# Patient Record
Sex: Female | Born: 2000 | Race: Black or African American | Hispanic: No | Marital: Single | Smoking: Never smoker
Health system: Southern US, Community
[De-identification: ages and names within clinical notes are randomized; demographics above are authoritative.]

## PROBLEM LIST (undated history)

## (undated) DIAGNOSIS — J309 Allergic rhinitis, unspecified: Secondary | ICD-10-CM

## (undated) HISTORY — DX: Allergic rhinitis, unspecified: J30.9

---
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
## 2021-06-01 DIAGNOSIS — L0291 Cutaneous abscess, unspecified: Secondary | ICD-10-CM | POA: Diagnosis not present

## 2021-08-21 DIAGNOSIS — Z3046 Encounter for surveillance of implantable subdermal contraceptive: Secondary | ICD-10-CM | POA: Diagnosis not present

## 2021-08-21 DIAGNOSIS — Z113 Encounter for screening for infections with a predominantly sexual mode of transmission: Secondary | ICD-10-CM | POA: Diagnosis not present

## 2021-08-21 DIAGNOSIS — Z3200 Encounter for pregnancy test, result unknown: Secondary | ICD-10-CM | POA: Diagnosis not present

## 2021-09-07 DIAGNOSIS — L0291 Cutaneous abscess, unspecified: Secondary | ICD-10-CM | POA: Diagnosis not present

## 2022-03-19 ENCOUNTER — Emergency Department (HOSPITAL_COMMUNITY)
Admission: EM | Admit: 2022-03-19 | Discharge: 2022-03-19 | Disposition: A | Discharge status: Home / Self Care | Payer: Medicaid Other | Attending: Emergency Medicine | Admitting: Emergency Medicine

## 2022-03-19 ENCOUNTER — Encounter (HOSPITAL_COMMUNITY): Payer: Self-pay

## 2022-03-19 ENCOUNTER — Emergency Department (HOSPITAL_COMMUNITY): Payer: Medicaid Other

## 2022-03-19 DIAGNOSIS — R197 Diarrhea, unspecified: Secondary | ICD-10-CM | POA: Diagnosis not present

## 2022-03-19 DIAGNOSIS — N9489 Other specified conditions associated with female genital organs and menstrual cycle: Secondary | ICD-10-CM | POA: Diagnosis not present

## 2022-03-19 DIAGNOSIS — K529 Noninfective gastroenteritis and colitis, unspecified: Secondary | ICD-10-CM | POA: Diagnosis not present

## 2022-03-19 DIAGNOSIS — R1013 Epigastric pain: Secondary | ICD-10-CM | POA: Diagnosis present

## 2022-03-19 LAB — LIPASE, BLOOD: Lipase: 20 U/L (ref 11–51)

## 2022-03-19 LAB — COMPREHENSIVE METABOLIC PANEL
ALT: 16 U/L (ref 0–44)
AST: 17 U/L (ref 15–41)
Albumin: 3.9 g/dL (ref 3.5–5.0)
Alkaline Phosphatase: 73 U/L (ref 38–126)
Anion gap: 5 (ref 5–15)
BUN: 6 mg/dL (ref 6–20)
CO2: 24 mmol/L (ref 22–32)
Calcium: 9 mg/dL (ref 8.9–10.3)
Chloride: 110 mmol/L (ref 98–111)
Creatinine, Ser: 0.67 mg/dL (ref 0.44–1.00)
GFR, Estimated: >60 mL/min (ref >60)
Glucose, Bld: 105 mg/dL — ABNORMAL HIGH (ref 70–99)
Potassium: 3.6 mmol/L (ref 3.5–5.1)
Sodium: 139 mmol/L (ref 135–145)
Total Bilirubin: 0.5 mg/dL (ref 0.3–1.2)
Total Protein: 7.7 g/dL (ref 6.5–8.1)

## 2022-03-19 LAB — CBC
HCT: 37.5 % (ref 36.0–46.0)
Hemoglobin: 12 g/dL (ref 12.0–15.0)
MCH: 27 pg (ref 26.0–34.0)
MCHC: 32 g/dL (ref 30.0–36.0)
MCV: 84.5 fL (ref 80.0–100.0)
Platelets: 296 10*3/uL (ref 150–400)
RBC: 4.44 MIL/uL (ref 3.87–5.11)
RDW: 14.8 % (ref 11.5–15.5)
WBC: 11.2 10*3/uL — ABNORMAL HIGH (ref 4.0–10.5)
nRBC: 0 % (ref 0.0–0.2)

## 2022-03-19 LAB — URINALYSIS, ROUTINE W REFLEX MICROSCOPIC
Bilirubin Urine: NEGATIVE
Glucose, UA: NEGATIVE mg/dL
Hgb urine dipstick: NEGATIVE
Ketones, ur: NEGATIVE mg/dL
Nitrite: NEGATIVE
Protein, ur: 30 mg/dL — AB
Specific Gravity, Urine: 1.03 (ref 1.005–1.030)
pH: 6 (ref 5.0–8.0)

## 2022-03-19 LAB — I-STAT BETA HCG BLOOD, ED (MC, WL, AP ONLY): I-stat hCG, quantitative: <5 m[IU]/mL (ref <5)

## 2022-03-19 MED ORDER — SODIUM CHLORIDE 0.9 % IV BOLUS
1000.0000 mL | Freq: Once | INTRAVENOUS | Status: AC
  Administered 2022-03-19: 1000 mL via INTRAVENOUS

## 2022-03-19 MED ORDER — IOHEXOL 300 MG/ML  SOLN
100.0000 mL | Freq: Once | INTRAMUSCULAR | Status: AC | PRN
Start: 2022-03-19 — End: 2022-03-19
  Administered 2022-03-19: 100 mL via INTRAVENOUS

## 2022-03-19 MED ORDER — KETOROLAC TROMETHAMINE 15 MG/ML IJ SOLN
15.0000 mg | Freq: Once | INTRAMUSCULAR | Status: AC
  Administered 2022-03-19: 15 mg via INTRAVENOUS
  Filled 2022-03-19: qty 1

## 2022-03-19 MED ORDER — LIDOCAINE VISCOUS HCL 2 % MT SOLN
30.0000 mL | Freq: Two times a day (BID) | OROMUCOSAL | 0 refills | Status: AC | PRN
Start: 2022-03-19 — End: 2022-03-22

## 2022-03-19 MED ORDER — DICYCLOMINE HCL 10 MG PO CAPS
10.0000 mg | ORAL_CAPSULE | Freq: Once | ORAL | Status: AC
  Administered 2022-03-19: 10 mg via ORAL
  Filled 2022-03-19: qty 1

## 2022-03-19 MED ORDER — LIDOCAINE VISCOUS HCL 2 % MT SOLN
15.0000 mL | Freq: Once | OROMUCOSAL | Status: AC
Start: 2022-03-19 — End: 2022-03-19
  Administered 2022-03-19: 15 mL via ORAL
  Filled 2022-03-19: qty 15

## 2022-03-19 MED ORDER — ALUM & MAG HYDROXIDE-SIMETH 200-200-20 MG/5ML PO SUSP
30.0000 mL | Freq: Once | ORAL | Status: AC
  Administered 2022-03-19: 30 mL via ORAL
  Filled 2022-03-19: qty 30

## 2022-03-19 MED ORDER — CIPROFLOXACIN HCL 500 MG PO TABS: 500.0000 mg | ORAL_TABLET | Freq: Two times a day (BID) | ORAL | 0 refills | Status: AC

## 2022-03-19 NOTE — ED Notes (Signed)
I provided reinforced discharge education based off of discharge instructions. Pt acknowledged and understood my education. Pt had no further questions/concerns for provider/myself.  °

## 2022-03-19 NOTE — Discharge Instructions (Signed)
As we discussed, CT imaging of your abdomen revealed some inflammation in your colon.  This can be due to bacteria and therefore I have given you a prescription for an antibiotic to take as prescribed for management of same.  Additionally, you noted some improvement with the oral mixture that we gave you, and therefore I have given you a prescription for same.  Please only take this as prescribed as needed.  I also recommend that you get a probiotic when you go to pick up your prescribed medications.  You can get this over-the-counter.  There are several studies that show improvement in diarrhea with addition of a probiotic.  I also recommend that you maintain a bland diet with foods such as bananas, rice, applesauce and toast.  I have attached some food choice recommendations to help relieve your symptoms.  Additionally, it is extremely important that you follow-up with your primary care doctor in the next few days to rule out other causes of your diarrhea that we discussed today.  Return if development of any new or worsening symptoms.

## 2022-03-19 NOTE — ED Provider Triage Note (Signed)
Emergency Medicine Provider Triage Evaluation Note  Isabel Hart , a 21 y.o. female  was evaluated in triage.  Pt complains of abdominal pain, vomiting, and diarrhea for the past 4 days.  Pain is worse with laying down.  No alleviating factors. No sick contacts.   Review of Systems  Positive: Abdominal pain, vomiting, diarrhea Negative: Fever, urinary symptoms  Physical Exam  BP (!) 143/78 (BP Location: Left Arm)   Pulse (!) 114   Temp 98.7 F (37.1 C) (Oral)   Resp 16   SpO2 95%  Gen:   Awake, no distress   Resp:  Normal effort  MSK:   Moves extremities without difficulty  Other:    Medical Decision Making  Medically screening exam initiated at 2:15 PM.  Appropriate orders placed.  Isabel Hart was informed that the remainder of the evaluation will be completed by another provider, this initial triage assessment does not replace that evaluation, and the importance of remaining in the ED until their evaluation is complete.     Norlene Lanes T, PA-C 03/19/22 1416

## 2022-03-19 NOTE — ED Provider Notes (Signed)
[facility]-EMERGENCY DEPT Provider Note   CSN: [id number] Arrival date & time: 03/19/22  1252     History  Chief Complaint  Patient presents with   Abdominal Pain    Isabel Hart is a 21 y.o. female.  Patient with no pertinent past medical history presents today with complaints of nausea, vomiting, diarrhea, and abdominal pain. She states that her symptoms began initially on Friday with several bouts of watery diarrhea. She states that same has persisted since then with 2 episodes of nausea and vomiting on Sunday. States that she is also having persistent epigastric abdominal pain that is always present but intermittently severe in nature without discernable triggers. She states that she has had a diminished appetite due to pain and nausea. She denies any dysuria or hematuria. Last menstrual cycle was about 6 weeks ago, but states that she has Nexplanon implant which has caused her to have irregular menstrual cycles. She denies any vaginal discharge, bleeding, or pain. Patient denies any hematochezia or melena. Denies any known sick contacts, recent travel or drinking of unclean water. No recent antibiotic use.  The history is provided by the patient. No language interpreter was used.  Abdominal Pain Associated symptoms: diarrhea, nausea and vomiting        Home Medications Prior to Admission medications   Medication Sig Start Date End Date Taking? Authorizing Provider  clindamycin-benzoyl peroxide (BENZACLIN) gel Apply each morning. 11/12/18   Jonetta Osgood, MD      Allergies    Patient has no known allergies.    Review of Systems   Review of Systems  Gastrointestinal:  Positive for abdominal pain, diarrhea, nausea and vomiting.  All other systems reviewed and are negative.   Physical Exam Updated Vital Signs BP 131/79   Pulse 86   Temp 98.7 F (37.1 C) (Oral)   Resp 18   SpO2 98%  Physical Exam Vitals and nursing note reviewed.  Constitutional:       General: She is not in acute distress.    Appearance: Normal appearance. She is normal weight. She is not ill-appearing, toxic-appearing or diaphoretic.  HENT:     Head: Normocephalic and atraumatic.  Cardiovascular:     Rate and Rhythm: Normal rate.  Pulmonary:     Effort: Pulmonary effort is normal. No respiratory distress.  Abdominal:     General: Abdomen is flat.     Palpations: Abdomen is soft.     Tenderness: There is abdominal tenderness in the periumbilical area.  Musculoskeletal:        General: Normal range of motion.     Cervical back: Normal range of motion.  Skin:    General: Skin is warm and dry.  Neurological:     General: No focal deficit present.     Mental Status: She is alert.  Psychiatric:        Mood and Affect: Mood normal.        Behavior: Behavior normal.     ED Results / Procedures / Treatments   Labs (all labs ordered are listed, but only abnormal results are displayed) Labs Reviewed  COMPREHENSIVE METABOLIC PANEL - Abnormal; Notable for the following components:      Result Value   Glucose, Bld 105 (*)    All other components within normal limits  CBC - Abnormal; Notable for the following components:   WBC 11.2 (*)    All other components within normal limits  URINALYSIS, ROUTINE W REFLEX MICROSCOPIC - Abnormal; Notable  for the following components:   APPearance CLOUDY (*)    Protein, ur 30 (*)    Leukocytes,Ua TRACE (*)    Bacteria, UA RARE (*)    All other components within normal limits  LIPASE, BLOOD  I-STAT BETA HCG BLOOD, ED ([facility], [facility], [facility] ONLY)    EKG None  Radiology No results found.  Procedures Procedures    Medications Ordered in ED Medications  sodium chloride 0.9 % bolus 1,000 mL (has no administration in time range)  dicyclomine (BENTYL) capsule 10 mg (has no administration in time range)    ED Course/ Medical Decision Making/ A&P                           Medical Decision Making Amount and/or Complexity of  Data Reviewed Labs: ordered. Radiology: ordered.  Risk Prescription drug management.   This patient presents to the ED for concern of abdominal pain, nausea, vomiting, diarrhea, this involves an extensive number of treatment options, and is a complaint that carries with it a high risk of complications and morbidity.   Co morbidities that complicate the patient evaluation  none   Lab Tests:  I Ordered, and personally interpreted labs.  The pertinent results include:  WBC 11.2. UA with proteinuria, trace leukocytes, rare bacteria. Patient denies urinary symptoms   Imaging Studies ordered:  I ordered imaging studies including CT abdomen pelvis  I independently visualized and interpreted imaging which showed  Diffuse wall thickening and edema involving the cecum and ascending colon with pericecal stranding, fluid, and lymphadenopathy. Changes likely to represent colitis, possibly infectious or inflammatory. Follow-up after resolution of acute process is recommended to exclude underlying neoplasm. I agree with the radiologist interpretation   Problem List / ED Course / Critical interventions / Medication management  I ordered medication including Toradol, bentyl, and Gi cocktail  for pain with fluids for dehydration  Reevaluation of the patient after these medicines showed that the patient improved I have reviewed the patients home medicines and have made adjustments as needed   Test / Admission - Considered:  Patient is nontoxic, nonseptic appearing, in no apparent distress.  Patient's pain and other symptoms adequately managed in emergency department.  Fluid bolus given.  Labs, imaging and vitals reviewed.  Patient does not meet the SIRS or Sepsis criteria.  On repeat exam patient does not have a surgical abdomin and there are no peritoneal signs.  No indication of appendicitis, bowel obstruction, bowel perforation, cholecystitis, diverticulitis, PID or ectopic pregnancy.   Patients symptoms consistent with CT findings of colitis. Will treat for infectious colitis with Ciprofloxacin. Patient also informed of CT findings and emphasized the importance of close PCP follow-up to exclude other causes of her symptoms. Given clinical picture I have a lower suspicion that this is the case, however she will need to be evaluated further once her symptoms improve. Patient is understanding and aware of this. Also recommend daily probiotic  and BRAT diet for the next few days to help with symptoms. Patient also noted improvement with GI cocktail, will discharge home with same. I have also discussed reasons to return immediately to the ER.  Patient expresses understanding and agrees with plan. All questions answered. Stable for discharge home at this time. She is able to tolerate po intake without nausea or vomiting. Discharged in stable condition.   Final Clinical Impression(s) / ED Diagnoses Final diagnoses:  Colitis  Diarrhea of presumed infectious origin  Rx / DC Orders ED Discharge Orders          Ordered    ciprofloxacin (CIPRO) 500 MG tablet  2 times daily        03/19/22 2339    GI Cocktail (alum & mag hydroxide, lidocaine, dicyclomine) oral mixture  2 times daily PRN        03/19/22 2339          An After Visit Summary was printed and given to the patient.     Silva Bandy, PA-C 03/19/22 2346    Horton, Clabe Seal, DO 03/20/22 0003

## 2022-03-19 NOTE — ED Triage Notes (Signed)
Pt arrived via POV, c/o n/v diarrhea, generalized abd pain, X3-4 days, denies any sick contacts. Denies any urinary sx.

## 2022-03-19 NOTE — ED Notes (Signed)
Pt ambulatory without assistance.  

## 2022-06-07 ENCOUNTER — Encounter (HOSPITAL_BASED_OUTPATIENT_CLINIC_OR_DEPARTMENT_OTHER): Payer: Self-pay | Admitting: Emergency Medicine

## 2022-06-07 ENCOUNTER — Emergency Department (HOSPITAL_BASED_OUTPATIENT_CLINIC_OR_DEPARTMENT_OTHER)
Admission: EM | Admit: 2022-06-07 | Discharge: 2022-06-07 | Disposition: A | Discharge status: Home / Self Care | Payer: Medicaid Other | Attending: Emergency Medicine | Admitting: Emergency Medicine

## 2022-06-07 ENCOUNTER — Other Ambulatory Visit: Payer: Self-pay

## 2022-06-07 DIAGNOSIS — L0231 Cutaneous abscess of buttock: Secondary | ICD-10-CM | POA: Insufficient documentation

## 2022-06-07 DIAGNOSIS — J020 Streptococcal pharyngitis: Secondary | ICD-10-CM

## 2022-06-07 LAB — GROUP A STREP BY PCR: Group A Strep by PCR: NOT DETECTED

## 2022-06-07 MED ORDER — OXYCODONE-ACETAMINOPHEN 5-325 MG PO TABS
1.0000 | ORAL_TABLET | Freq: Once | ORAL | Status: AC
  Administered 2022-06-07: 1 via ORAL
  Filled 2022-06-07: qty 1

## 2022-06-07 MED ORDER — IBUPROFEN 800 MG PO TABS
800.0000 mg | ORAL_TABLET | Freq: Once | ORAL | Status: AC
  Administered 2022-06-07: 800 mg via ORAL
  Filled 2022-06-07: qty 1

## 2022-06-07 MED ORDER — LIDOCAINE-EPINEPHRINE-TETRACAINE (LET) TOPICAL GEL
3.0000 mL | Freq: Once | TOPICAL | Status: AC
  Administered 2022-06-07: 3 mL via TOPICAL
  Filled 2022-06-07: qty 3

## 2022-06-07 MED ORDER — DOXYCYCLINE HYCLATE 100 MG PO CAPS: 100.0000 mg | ORAL_CAPSULE | Freq: Two times a day (BID) | ORAL | 0 refills | Status: AC

## 2022-06-07 NOTE — Discharge Instructions (Addendum)
You need to have the packing removed from your wound within 3 to 4 days.  This can be done at your doctor's office or in urgent care, or else you can pull it out yourself.  You can shower normally with warm water and soap.  The wound needs to continue to drain for the next 1 to 2 days to make sure all the pus comes out.  Please take the antibiotics as directed.

## 2022-06-07 NOTE — ED Triage Notes (Signed)
Pt arrives to ED with c/o cyst to upper buttocks that's started x3 days ago and sore throat that started today.

## 2022-06-07 NOTE — ED Provider Notes (Signed)
[facility] EMERGENCY DEPT Provider Note   CSN: [id number] Arrival date & time: 06/07/22  8315     History  Chief Complaint  Patient presents with   Cyst   Sore Throat    Isabel Hart is a 21 y.o. female presented to ED with sore throat and a cyst on her bottom.  She reports that she felt lightheaded and had some congestion yesterday, had a sore throat this morning.  She is also complaining of a "cyst on her buttocks" for the past 3 days.  She has had 1 here prior that was drained in the past.  Denies fevers or chills  HPI     Home Medications Prior to Admission medications   Medication Sig Start Date End Date Taking? Authorizing Provider  doxycycline (VIBRAMYCIN) 100 MG capsule Take 1 capsule (100 mg total) by mouth 2 (two) times daily for 5 days. 06/07/22 06/12/22 Yes Ayano Douthitt, Kermit Balo, MD  clindamycin-benzoyl peroxide [facility]) gel Apply each morning. 11/12/18   Jonetta Osgood, MD      Allergies    Patient has no known allergies.    Review of Systems   Review of Systems  Physical Exam Updated Vital Signs BP (!) 147/100 (BP Location: Right Arm)   Pulse (!) 102   Temp 98.4 F (36.9 C)   Resp 14   Ht 5\' 5"  (1.651 m)   Wt 103 kg   SpO2 100%   BMI 37.77 kg/m  Physical Exam Constitutional:      General: She is not in acute distress. HENT:     Head: Normocephalic and atraumatic.     Mouth/Throat:     Mouth: Mucous membranes are moist. No oral lesions.     Pharynx: No pharyngeal swelling, oropharyngeal exudate or uvula swelling.  Eyes:     Conjunctiva/sclera: Conjunctivae normal.     Pupils: Pupils are equal, round, and reactive to light.  Cardiovascular:     Rate and Rhythm: Normal rate and regular rhythm.  Pulmonary:     Effort: Pulmonary effort is normal. No respiratory distress.  Abdominal:     General: There is no distension.     Tenderness: There is no abdominal tenderness.  Skin:    General: Skin is warm and dry.     Comments: Approximate  2 to 3 cm region of fluctuance and induration near the gluteal cleft  Neurological:     General: No focal deficit present.     Mental Status: She is alert. Mental status is at baseline.  Psychiatric:        Mood and Affect: Mood normal.        Behavior: Behavior normal.     ED Results / Procedures / Treatments   Labs (all labs ordered are listed, but only abnormal results are displayed) Labs Reviewed  GROUP A STREP BY PCR    EKG None  Radiology No results found.  Procedures . Incision and Drainage  Date/Time: 06/07/2022 1:01 PM  Performed by: 08/07/2022, MD Authorized by: Terald Sleeper, MD   Consent:    Consent obtained:  Verbal   Consent given by:  Patient   Risks discussed:  Bleeding, infection and incomplete drainage Universal protocol:    Procedure explained and questions answered to patient or proxy's satisfaction: yes     Imaging studies available: yes     Site/side marked: yes     Immediately prior to procedure, a time out was called: yes     Patient identity confirmed:  Arm band Location:    Type:  Abscess   Size:  3   Location:  Anogenital   Anogenital location:  Gluteal cleft Pre-procedure details:    Skin preparation:  Chlorhexidine with alcohol Sedation:    Sedation type:  None Anesthesia:    Anesthesia method:  Topical application   Topical anesthetic:  LET Procedure type:    Complexity:  Complex Procedure details:    Ultrasound guidance: yes     Needle aspiration: no     Incision types:  Stab incision   Wound management:  Probed and deloculated and extensive cleaning   Drainage:  Purulent   Drainage amount:  Copious   Wound treatment:  Drain placed   Packing materials:  1/4 in iodoform gauze Post-procedure details:    Procedure completion:  Tolerated well, no immediate complications     Medications Ordered in ED Medications  ibuprofen (ADVIL) tablet 800 mg (800 mg Oral Given 06/07/22 1202)  oxyCODONE-acetaminophen  (PERCOCET/ROXICET) 5-325 MG per tablet 1 tablet (1 tablet Oral Given 06/07/22 1203)  lidocaine-EPINEPHrine-tetracaine (LET) topical gel (3 mLs Topical Given 06/07/22 1203)    ED Course/ Medical Decision Making/ A&P                           Medical Decision Making Risk Prescription drug management.   Patient is here with sore throat which is suspected viral syndrome in the setting of his symptoms the past 2 days.  Do not see evidence of strep pharyngitis, deep space infection, peritonsillar abscess on my exam.  Do not believe any blood work or imaging.  Rapid strep was negative.  She has an abscess which I examined on bedside ultrasound which appears to be just below the surface near the gluteal cleft, more so on the right upper buttock, but appears to be approximate 2 to 3 cm in depth.  I think this is reasonable for drainage.  Patient was consented for I&D.  She was given ibuprofen and Percocet for pain, and let cream applied.  The wound was successfully drained, deloculated, and then packed.  Started on antibiotics for 5 days as prophylaxis.  Follow-up with PCP to have packing removed in 3 to 4 days.        Final Clinical Impression(s) / ED Diagnoses Final diagnoses:  Abscess of buttock  Streptococcal sore throat    Rx / DC Orders ED Discharge Orders          Ordered    doxycycline (VIBRAMYCIN) 100 MG capsule  2 times daily        06/07/22 1300              Terald Sleeper, MD 06/07/22 1302

## 2022-06-07 NOTE — ED Notes (Signed)
Patient verbalizes understanding of discharge instructions. Opportunity for questioning and answers were provided. Patient discharged from ED.  °

## 2022-08-17 IMAGING — CT CT ABD-PELV W/ CM
2 of 4 series · 16 of 46 positions shown, 18 images · IV contrast (agent unspecified)
Comparison: None Available.

CLINICAL DATA: Acute nonlocalized abdominal pain. Nausea, vomiting,
diarrhea, generalized abdominal pain.

EXAM:
CT ABDOMEN AND PELVIS WITH CONTRAST
TECHNIQUE: Multidetector CT imaging of the abdomen and pelvis was performed
using the standard protocol following bolus administration of
intravenous contrast.

[Series 2: axial st · axial · 0.80mm/px · z∈[-492,-72]mm · 13 of 96 slices shown, 15 images]
[im 6/96  soft-tissue]
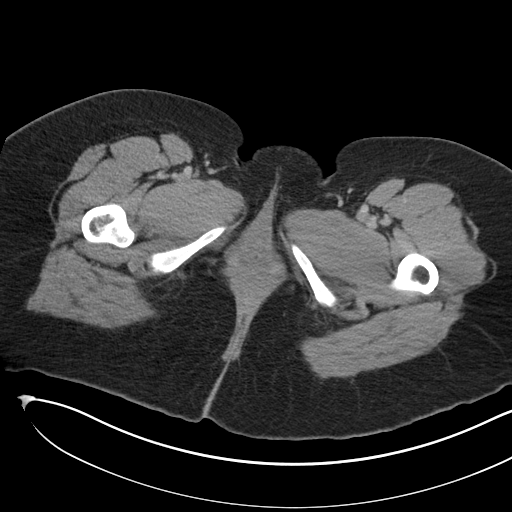
[im 6/96  bone]
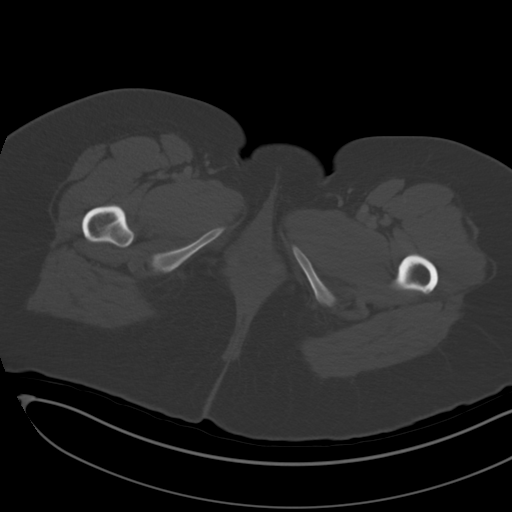
[im 11/96  soft-tissue]
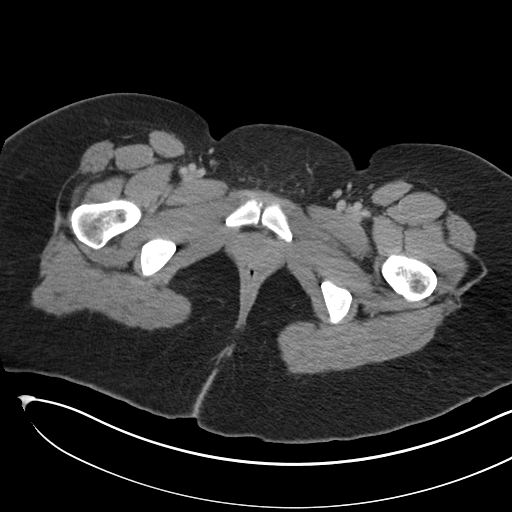
[im 22/96  soft-tissue]
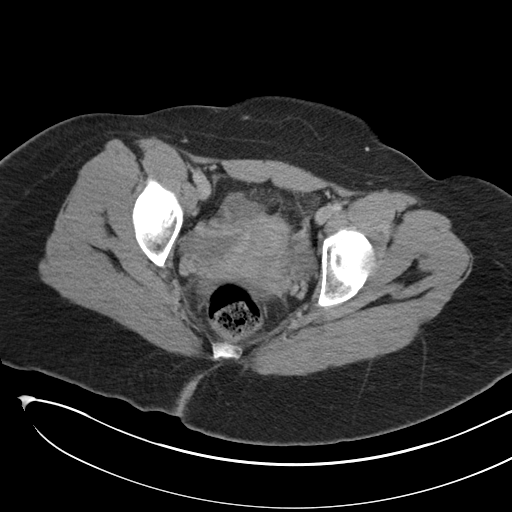
[im 27/96  soft-tissue]
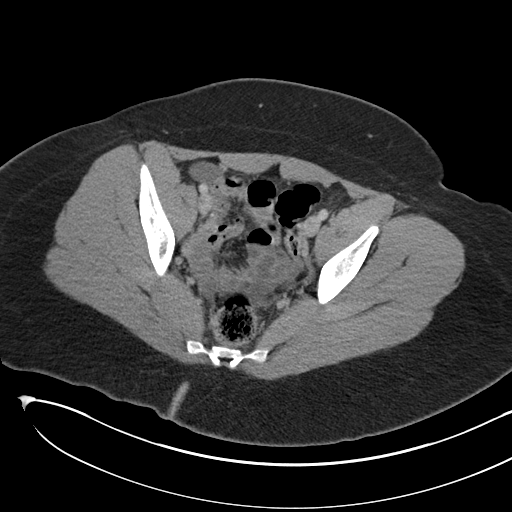
[im 32/96  soft-tissue]
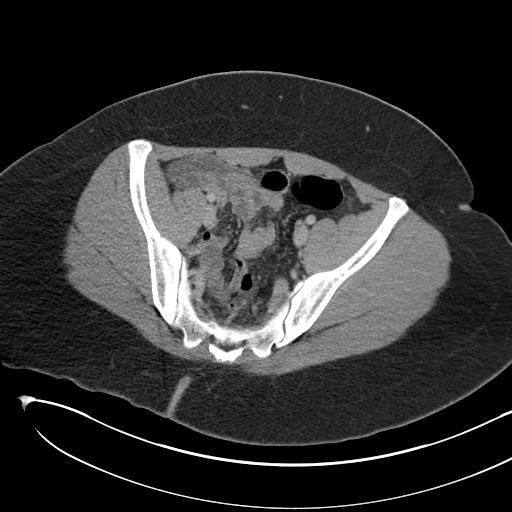
[im 43/96  soft-tissue]
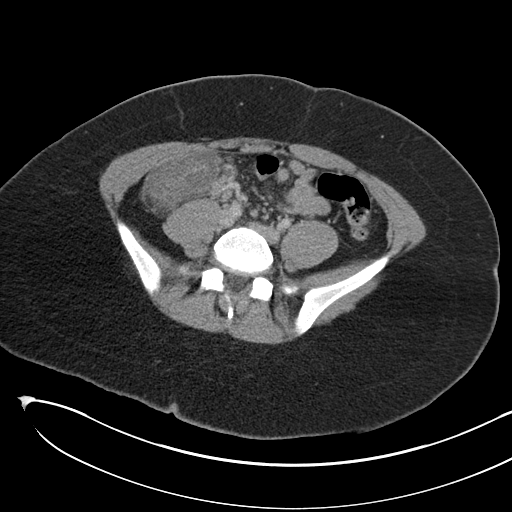
[im 48/96  soft-tissue]
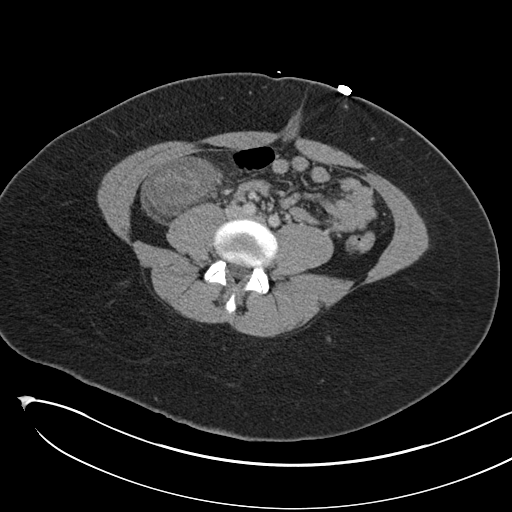
[im 53/96  soft-tissue]
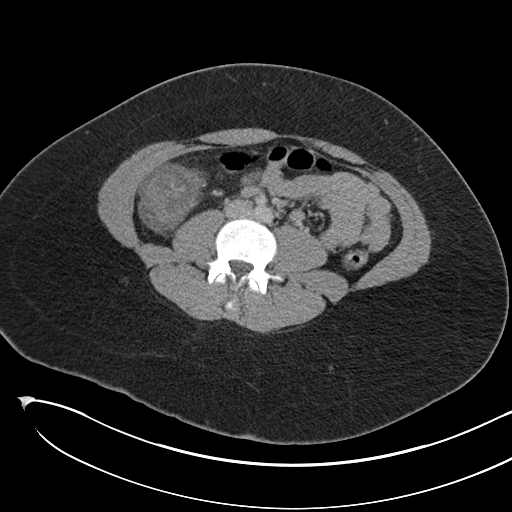
[im 64/96  soft-tissue]
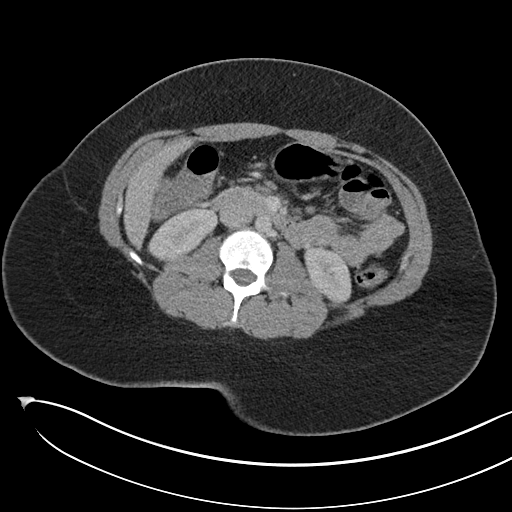
[im 64/96  bone]
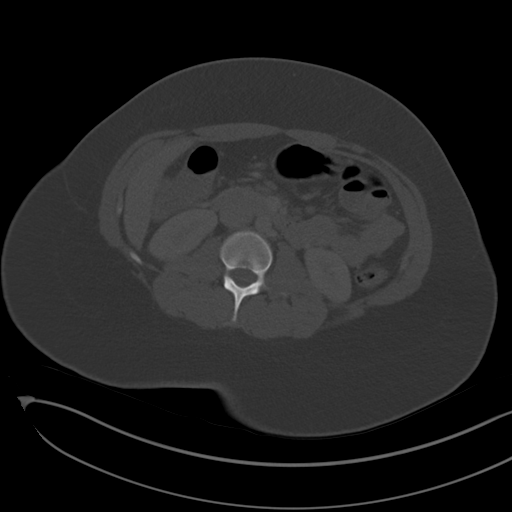
[im 69/96  soft-tissue]
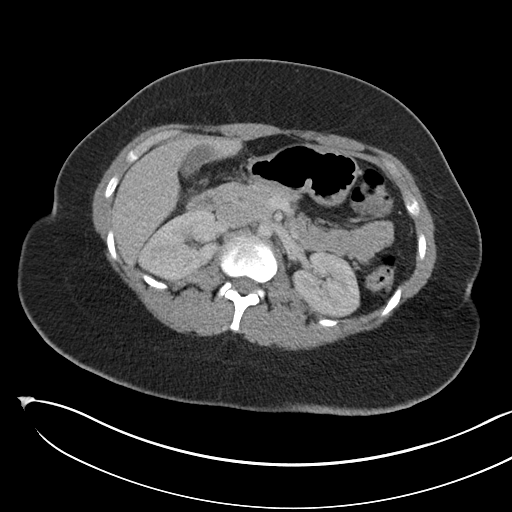
[im 74/96  soft-tissue]
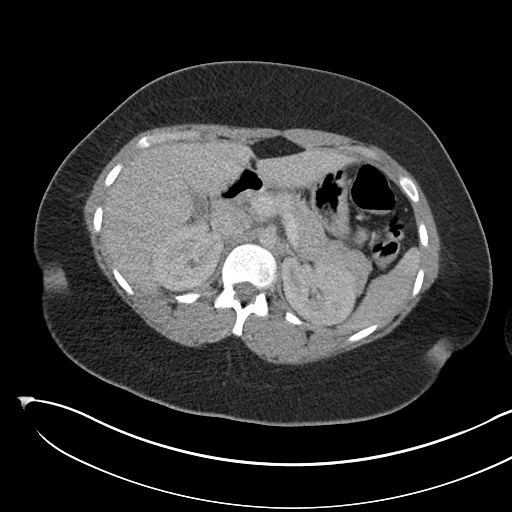
[im 85/96  soft-tissue]
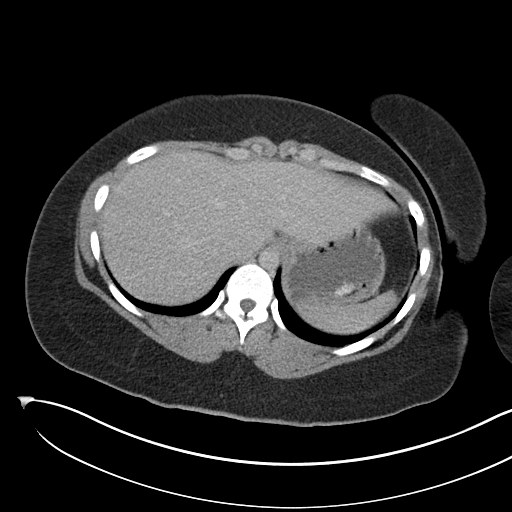
[im 90/96  soft-tissue]
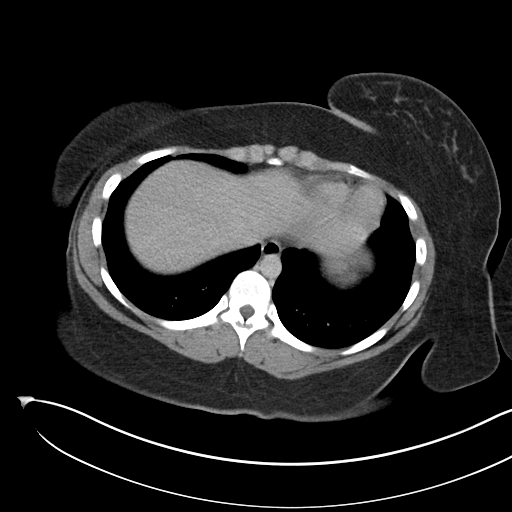

[Series 4: coronal st · coronal · 0.84mm/px · 3 of 151 slices shown]
[im 51/151  soft-tissue]
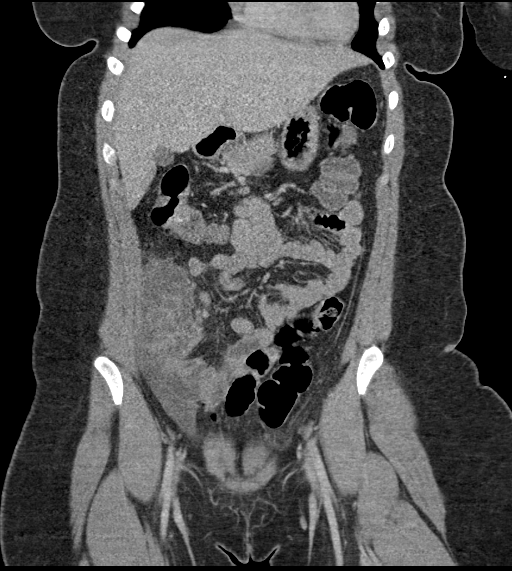
[im 67/151  soft-tissue]
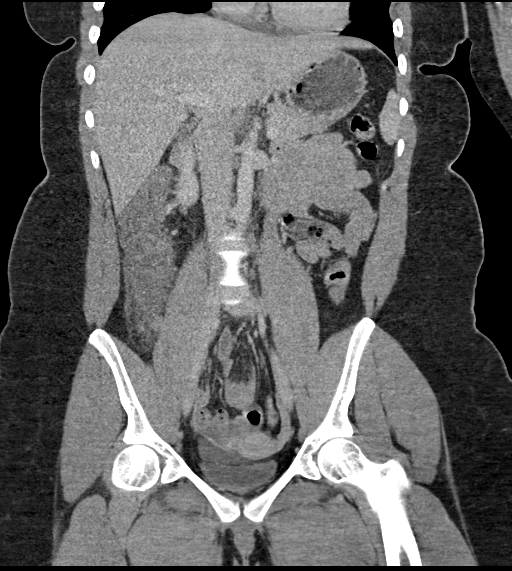
[im 84/151  soft-tissue]
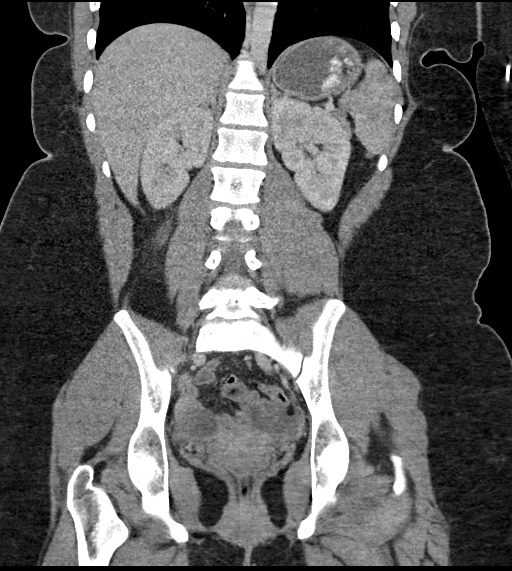

[16 of 46 positions shown; findings below may reference images not displayed]

RADIATION DOSE REDUCTION: This exam was performed according to the
departmental dose-optimization program which includes automated
exposure control, adjustment of the mA and/or kV according to
patient size and/or use of iterative reconstruction technique.

CONTRAST:  100mL OMNIPAQUE IOHEXOL 300 MG/ML  SOLN
FINDINGS: Lower chest: Lung bases are clear.

Hepatobiliary: No focal liver abnormality is seen. No gallstones,
gallbladder wall thickening, or biliary dilatation.

Pancreas: Unremarkable. No pancreatic ductal dilatation or
surrounding inflammatory changes.

Spleen: Normal in size without focal abnormality.

Adrenals/Urinary Tract: Adrenal glands are unremarkable. Kidneys are
normal, without renal calculi, focal lesion, or hydronephrosis.
Bladder is unremarkable.

Stomach/Bowel: Stomach, small bowel, and colon are not abnormally
distended. There is diffuse wall thickening and edema involving the
cecum and ascending colon with mild pericecal stranding. This
suggests colitis. Consider infectious colitis, inflammatory bowel
disease or possibly typhlitis if in the appropriate clinical
setting. Adjacent pericecal lymphadenopathy and free fluid is likely
reactive. Follow-up after resolution of the acute process is
recommended to exclude underlying neoplasm.

Vascular/Lymphatic: No significant vascular findings are present. No
enlarged abdominal or pelvic lymph nodes.

Reproductive: Uterus and bilateral adnexa are unremarkable.

Other: No free air or free fluid in the abdomen. Abdominal wall
musculature appears intact.

Musculoskeletal: No acute or significant osseous findings.
IMPRESSION: Diffuse wall thickening and edema involving the cecum and ascending
colon with pericecal stranding, fluid, and lymphadenopathy. Changes
likely to represent colitis, possibly infectious or inflammatory.
Follow-up after resolution of acute process is recommended to
exclude underlying neoplasm.

## 2023-04-18 ENCOUNTER — Ambulatory Visit: Payer: Managed Care, Other (non HMO) | Admitting: Family Medicine

## 2023-04-18 ENCOUNTER — Encounter: Payer: Self-pay | Admitting: Family Medicine

## 2023-04-18 VITALS — BP 118/80 | HR 60 | Temp 98.5°F | Ht 66.0 in | Wt 231.1 lb

## 2023-04-18 DIAGNOSIS — L700 Acne vulgaris: Secondary | ICD-10-CM

## 2023-04-18 DIAGNOSIS — E6609 Other obesity due to excess calories: Secondary | ICD-10-CM

## 2023-04-18 DIAGNOSIS — Z6837 Body mass index (BMI) 37.0-37.9, adult: Secondary | ICD-10-CM

## 2023-04-18 LAB — LIPID PANEL
Cholesterol: 181 mg/dL (ref 0–200)
HDL: 55.4 mg/dL (ref >39.00)
LDL Cholesterol: 116 mg/dL — ABNORMAL HIGH (ref 0–99)
NonHDL: 125.39
Total CHOL/HDL Ratio: 3
Triglycerides: 47 mg/dL (ref 0.0–149.0)
VLDL: 9.4 mg/dL (ref 0.0–40.0)

## 2023-04-18 LAB — COMPREHENSIVE METABOLIC PANEL
ALT: 14 U/L (ref 0–35)
AST: 22 U/L (ref 0–37)
Albumin: 4.2 g/dL (ref 3.5–5.2)
Alkaline Phosphatase: 74 U/L (ref 39–117)
BUN: 8 mg/dL (ref 6–23)
CO2: 24 mEq/L (ref 19–32)
Calcium: 9.5 mg/dL (ref 8.4–10.5)
Chloride: 107 mEq/L (ref 96–112)
Creatinine, Ser: 0.81 mg/dL (ref 0.40–1.20)
GFR: 103.93 mL/min (ref >60.00)
Glucose, Bld: 87 mg/dL (ref 70–99)
Potassium: 4.3 mEq/L (ref 3.5–5.1)
Sodium: 138 mEq/L (ref 135–145)
Total Bilirubin: 0.4 mg/dL (ref 0.2–1.2)
Total Protein: 7.7 g/dL (ref 6.0–8.3)

## 2023-04-18 LAB — TSH: TSH: 1.01 u[IU]/mL (ref 0.35–5.50)

## 2023-04-18 LAB — HEMOGLOBIN A1C: Hgb A1c MFr Bld: 5.7 % (ref 4.6–6.5)

## 2023-04-18 MED ORDER — BENZOYL PEROXIDE WASH 5 % EX LIQD: Freq: Every day | CUTANEOUS | 11 refills | Status: AC

## 2023-04-18 MED ORDER — TRETINOIN 0.05 % EX CREA: TOPICAL_CREAM | Freq: Every day | CUTANEOUS | 5 refills | Status: DC

## 2023-04-18 MED ORDER — ZEPBOUND 2.5 MG/0.5ML ~~LOC~~ SOAJ: 2.5000 mg | SUBCUTANEOUS | 0 refills | Status: DC

## 2023-04-18 NOTE — Assessment & Plan Note (Addendum)
I have had an extensive 30 minute conversation today with the patient about healthy eating habits, exercise, calorie and carb goals for sustainable and successful weight loss. I gave the patient caloric and protein daily intake values as well as described the importance of increasing fiber and water intake. I discussed weight loss medications that could be used in the treatment of this patient. Handouts on low carb eating were given to the patient.  We discussed medications since she failed diet and exercise alone. I will place an order for Zepbound to get started on this medication. I have placed orders for bloodwork as well to look for co morbid conditions.   Total Calories per day: 1900 -- 2000 per day.  Total protein per day: at least 109 grams per day  Total carbohydrates per day: less than 100 gram per day

## 2023-04-18 NOTE — Assessment & Plan Note (Signed)
I gave patient a skin regimen consisting of benzoyl peroxide in the morning, moisturizer, and tretinoin in the evening. Recommended body scrubs 2-3 times per week to remove dead skin. Will go ahead and send to dermatology -- it will take some time for her to be seen, however in the meantime we can continue to treat in this manner until her appt.

## 2023-04-18 NOTE — Patient Instructions (Addendum)
Total Calories per day: 1900 -- 2000 per day.  Total protein per day: at least 109 grams per day  Total carbohydrates per day: less than 100 gram per day  Check blood pressure at home, if you get blood pressures consistently above 140/90 then you might have hypertension.   Benzoyl in the morning, tretinoin at night. Cetaphil or aquaphor or CeraVe water based moisturizer daily. Exfoliation of skin  2-3 times a week

## 2023-04-18 NOTE — Progress Notes (Signed)
New Patient Office Visit  Subjective    Patient ID: Isabel Hart, female    DOB: 2000/10/28  Age: 22 y.o. MRN: [id number]  CC:  Chief Complaint  Patient presents with   Establish Care    HPI Isabel Hart presents to establish care Patient is here to discuss her history of acne. States that she has had acne for a long time. States that it is getting worse, has spread up to her back and on the backs of her arms. She was just seen 1 week ago for a pilonidal abscess, states this has happened to her before. Has tried various creams that other providers have prescribed, also OTC creams to try and help. Sometimes has some on her chest and neck but this comes and goes.   Patient is on nexplanon, placed about a year ago. She reports that she did gain weight after she had it placed but it seems to have stabilized. States that however much she exercises she cannot seem to lose any weight. States she exercises 5 days a week, has a Systems analyst, does cardio and weights, has been doing this for 7 months.  States that she has been doing calorie deficit off and on, has been doing this for about 2 years. States she will lose a few pounds, but it never has been more than 3-5 pounds. Feels like it might be the implant that is causing her to not be able to lose weight despite her efforts.   Has not tried any other medications to lose weight, co morbid conditions TBD based on blood work.     Current Outpatient Medications  Medication Instructions   benzoyl peroxide 5 % external liquid Topical, Daily   doxycycline (ADOXA) 100 mg, Oral, 2 times daily   tretinoin (RETIN-A) 0.05 % cream Topical, Daily at bedtime   Zepbound 2.5 mg, Subcutaneous, Weekly    Past Medical History:  Diagnosis Date   Allergic rhinitis 07/2011   from Dr. Charlean Merl record Rx flonase    History reviewed. No pertinent surgical history.  Family History  Problem Relation Age of Onset   Diabetes Mother    Asthma Brother     Asthma Maternal Aunt    Diabetes Maternal Grandfather    Diabetes Paternal Grandmother     Social History   Socioeconomic History   Marital status: Single    Spouse name: Not on file   Number of children: Not on file   Years of education: Not on file   Highest education level: Not on file  Occupational History   Not on file  Tobacco Use   Smoking status: Never   Smokeless tobacco: Never  Vaping Use   Vaping status: Never Used  Substance and Sexual Activity   Alcohol use: Yes   Drug use: Never   Sexual activity: Not Currently  Other Topics Concern   Not on file  Social History Narrative   Not on file   Social Determinants of Health   Financial Resource Strain: Not on file  Food Insecurity: Not on file  Transportation Needs: Not on file  Physical Activity: Not on file  Stress: Not on file  Social Connections: Not on file  Intimate Partner Violence: Not on file    Review of Systems  All other systems reviewed and are negative.       Objective    BP 118/80 (BP Location: Left Arm, Patient Position: Sitting, Cuff Size: Large)   Pulse 60   Temp 98.5  F (36.9 C) (Oral)   Ht 5\' 6"  (1.676 m)   Wt 231 lb 1.6 oz (104.8 kg)   LMP 04/16/2023 (Exact Date)   SpO2 98%   BMI 37.30 kg/m   Physical Exam Constitutional:      General: She is not in acute distress.    Appearance: Normal appearance. She is obese.  Eyes:     Conjunctiva/sclera: Conjunctivae normal.  Pulmonary:     Effort: Pulmonary effort is normal.  Musculoskeletal:     Right lower leg: No edema.     Left lower leg: No edema.  Skin:    Findings: Rash (diffuse raised papular rash over the back, arms, and face, there are areas of postinflammatory hyperpigmentation in the same distribution) present.  Neurological:     General: No focal deficit present.     Mental Status: She is alert and oriented to person, place, and time.  Psychiatric:        Mood and Affect: Mood normal.        Behavior: Behavior  normal.         Assessment & Plan:  Acne vulgaris Assessment & Plan: I gave patient a skin regimen consisting of benzoyl peroxide in the morning, moisturizer, and tretinoin in the evening. Recommended body scrubs 2-3 times per week to remove dead skin. Will go ahead and send to dermatology -- it will take some time for her to be seen, however in the meantime we can continue to treat in this manner until her appt.   Orders: -     Ambulatory referral to Dermatology -     Benzoyl Peroxide Wash; Apply topically daily.  Dispense: 226 g; Refill: 11 -     Tretinoin; Apply topically at bedtime.  Dispense: 45 g; Refill: 5  Class 2 obesity due to excess calories with body mass index (BMI) of 37.0 to 37.9 in adult, unspecified whether serious comorbidity present Assessment & Plan: I have had an extensive 30 minute conversation today with the patient about healthy eating habits, exercise, calorie and carb goals for sustainable and successful weight loss. I gave the patient caloric and protein daily intake values as well as described the importance of increasing fiber and water intake. I discussed weight loss medications that could be used in the treatment of this patient. Handouts on low carb eating were given to the patient.  We discussed medications since she failed diet and exercise alone. I will place an order for Zepbound to get started on this medication. I have placed orders for bloodwork as well to look for co morbid conditions.   Total Calories per day: 1900 -- 2000 per day.  Total protein per day: at least 109 grams per day  Total carbohydrates per day: less than 100 gram per day   Orders: -     Comprehensive metabolic panel -     TSH -     Hemoglobin A1c -     Zepbound; Inject 2.5 mg into the skin once a week.  Dispense: 2 mL; Refill: 0 -     Lipid panel   I spent 45 minutes with patient today collecting history, counseling on skin regimen and weight loss, and discussing  medication. Return in about 3 months (around 07/19/2023) for weight loss.   Karie Georges, MD

## 2023-04-19 ENCOUNTER — Telehealth: Payer: Self-pay

## 2023-04-19 NOTE — Telephone Encounter (Signed)
Primary  PA request received for Zepbound 2.5MG /0.5ML pen-injectors  PA submitted to [facility] via CMM and has been APPROVED from 04/19/2023-12/14/2023

## 2023-05-19 ENCOUNTER — Other Ambulatory Visit: Payer: Self-pay | Admitting: Family Medicine

## 2023-05-19 DIAGNOSIS — E6609 Other obesity due to excess calories: Secondary | ICD-10-CM

## 2023-05-20 MED ORDER — ZEPBOUND 5 MG/0.5ML ~~LOC~~ SOAJ: 5.0000 mg | SUBCUTANEOUS | 0 refills | Status: DC

## 2023-05-20 MED ORDER — ZEPBOUND 7.5 MG/0.5ML ~~LOC~~ SOAJ: 7.5000 mg | SUBCUTANEOUS | 0 refills | Status: DC

## 2023-06-20 ENCOUNTER — Encounter: Payer: Self-pay | Admitting: Family Medicine

## 2023-06-20 DIAGNOSIS — E669 Obesity, unspecified: Secondary | ICD-10-CM

## 2023-07-15 MED ORDER — ZEPBOUND 10 MG/0.5ML ~~LOC~~ SOAJ: 10.0000 mg | SUBCUTANEOUS | 0 refills | Status: DC

## 2023-07-15 MED ORDER — ZEPBOUND 12.5 MG/0.5ML ~~LOC~~ SOAJ: 12.5000 mg | SUBCUTANEOUS | 0 refills | Status: DC

## 2023-07-15 MED ORDER — ZEPBOUND 15 MG/0.5ML ~~LOC~~ SOAJ: 15.0000 mg | SUBCUTANEOUS | 5 refills | Status: DC

## 2023-09-11 ENCOUNTER — Other Ambulatory Visit: Payer: Self-pay | Admitting: Family Medicine

## 2023-09-11 DIAGNOSIS — E669 Obesity, unspecified: Secondary | ICD-10-CM

## 2023-09-11 NOTE — Telephone Encounter (Signed)
Please refuse this-- pt should be advancing to the 15 mg dose which should already be at the pharmacy (sent in October with the other scripts). Thanks! Patient will need to call the pharmacy and ask them to fill the 15 mg script I sent. Let me know if she has any trouble!

## 2023-09-23 ENCOUNTER — Encounter: Payer: Self-pay | Admitting: Family Medicine

## 2023-09-23 ENCOUNTER — Ambulatory Visit (INDEPENDENT_AMBULATORY_CARE_PROVIDER_SITE_OTHER): Payer: Managed Care, Other (non HMO) | Admitting: Family Medicine

## 2023-09-23 DIAGNOSIS — E669 Obesity, unspecified: Secondary | ICD-10-CM

## 2023-09-23 NOTE — Patient Instructions (Signed)
Add a prenatal multivitamin once daily.

## 2023-09-23 NOTE — Progress Notes (Signed)
   Established Patient Office Visit  Subjective   Patient ID: Isabel Hart, female    DOB: [birth year]  Age: 22 y.o. MRN: [id number]  Chief Complaint  Patient presents with   Medical Management of Chronic Issues    Pt is here for follow up on weight loss today. She is reporting increased frequency of stools but no diarrhea. States that she is eating more protein-- is eating protein bowls at [employer] which will last her about 4 days. States she does have trouble getting more vegetables. States that she is very happy with the results so far    Current Outpatient Medications  Medication Instructions   benzoyl peroxide 5 % external liquid Topical, Daily   doxycycline (ADOXA) 100 mg, 2 times daily   tretinoin (RETIN-A) 0.05 % cream Topical, Daily at bedtime   Zepbound 15 mg, Subcutaneous, Weekly    Patient Active Problem List   Diagnosis Date Noted   Dysuria 04/28/2018   Constipation 04/28/2018   Hyperhidrosis 11/22/2017   Heart murmur 11/22/2017   Obesity (BMI 35.0-39.9 without comorbidity) 11/22/2017   Acne vulgaris 11/22/2017   Adjustment disorder with depressed mood 08/22/2014   Other seasonal allergic rhinitis 07/15/2014      Review of Systems  All other systems reviewed and are negative.     Objective:     BP 110/60   Pulse 78   Temp 98.5 F (36.9 C) (Oral)   Ht 5\' 6"  (1.676 m)   Wt 191 lb 8 oz (86.9 kg)   LMP 08/12/2023 (Exact Date)   SpO2 98%   BMI 30.91 kg/m    Physical Exam Vitals reviewed.  Constitutional:      Appearance: Normal appearance. She is obese.  Cardiovascular:     Rate and Rhythm: Normal rate and regular rhythm.     Heart sounds: Normal heart sounds. No murmur heard. Pulmonary:     Effort: Pulmonary effort is normal.  Abdominal:     General: Bowel sounds are normal.  Neurological:     Mental Status: She is alert and oriented to person, place, and time. Mental status is at baseline.  Psychiatric:        Mood and Affect: Mood  normal.        Behavior: Behavior normal.      No results found for any visits on 09/23/23.    The ASCVD Risk score (Arnett DK, et al., 2019) failed to calculate for the following reasons:   The 2019 ASCVD risk score is only valid for ages 82 to 56    Assessment & Plan:  Obesity (BMI 35.0-39.9 without comorbidity) Assessment & Plan: Pt is doing very well on the zepbound, has lost 40 pounds since her last visit in July, will continue this as prescribed, I reinforced her dietary changes and advised she continue her high protein diet. Will continue the 15 mg weekly dosing as prescribed      Return in about 30 weeks (around 04/20/2024) for annual physical with pap smear.    Karie Georges, MD

## 2023-09-30 NOTE — Assessment & Plan Note (Addendum)
Pt is doing very well on the zepbound, has lost 40 pounds since her last visit in July, will continue this as prescribed, I reinforced her dietary changes and advised she continue her high protein diet. Will continue the 15 mg weekly dosing as prescribed

## 2023-10-04 ENCOUNTER — Other Ambulatory Visit (HOSPITAL_COMMUNITY): Payer: Self-pay

## 2023-10-09 ENCOUNTER — Encounter: Payer: Self-pay | Admitting: Family Medicine

## 2023-10-09 DIAGNOSIS — E669 Obesity, unspecified: Secondary | ICD-10-CM

## 2023-10-10 ENCOUNTER — Other Ambulatory Visit (HOSPITAL_COMMUNITY): Payer: Self-pay

## 2023-10-10 ENCOUNTER — Telehealth: Payer: Self-pay | Admitting: Pharmacy Technician

## 2023-10-10 NOTE — Telephone Encounter (Addendum)
 Pharmacy Patient Advocate Encounter   Received notification from Patient Advice Request messages that prior authorization for Zepbound  15mg  is required/requested.   Insurance verification completed.   The patient is insured through [employer] .   Per test claim: PA required and submitted KEY/EOC/Request #: [id number] CMM unable to retrieve clinical questions. See below for the reasoning.

## 2023-10-10 NOTE — Telephone Encounter (Signed)
 Unfortunately this medication is no longer covered by the pt's ins plan.  PA was started via Cover My meds, but it was unable to retrieve clinical questions. Message from [facility]: Drug Is not covered by the plan.

## 2023-10-17 MED ORDER — ZEPBOUND 15 MG/0.5ML ~~LOC~~ SOAJ: 15.0000 mg | SUBCUTANEOUS | 5 refills | Status: DC

## 2023-10-22 ENCOUNTER — Encounter: Payer: Self-pay | Admitting: Dermatology

## 2023-10-22 ENCOUNTER — Ambulatory Visit (INDEPENDENT_AMBULATORY_CARE_PROVIDER_SITE_OTHER): Payer: Managed Care, Other (non HMO) | Admitting: Dermatology

## 2023-10-22 VITALS — BP 121/76

## 2023-10-22 DIAGNOSIS — L810 Postinflammatory hyperpigmentation: Secondary | ICD-10-CM

## 2023-10-22 DIAGNOSIS — L700 Acne vulgaris: Secondary | ICD-10-CM | POA: Diagnosis not present

## 2023-10-22 MED ORDER — ARAZLO 0.045 % EX LOTN: 1.0000 "application " | TOPICAL_LOTION | Freq: Every evening | CUTANEOUS | 3 refills | Status: DC

## 2023-10-22 MED ORDER — SPIRONOLACTONE 100 MG PO TABS: 100.0000 mg | ORAL_TABLET | Freq: Every morning | ORAL | 3 refills | Status: DC

## 2023-10-22 MED ORDER — CLINDAMYCIN PHOSPHATE 1 % EX SWAB: 1.0000 "application " | Freq: Every morning | CUTANEOUS | 3 refills | Status: DC

## 2023-10-22 NOTE — Patient Instructions (Addendum)
Hello [name],  Thank you for visiting [place] today. We appreciate your commitment to improving your skin health. Here is a summary of the key instructions from today's consultation for your Acne:  - Prescription Medications:   - Spironolactone 100 mg: Take once daily with dinner.     - Side effects: Monitor for lightheadedness or dizziness.     - Instructions: Ensure adequate hydration.   - Arazlo (tazarotene): Apply a pea-sized amount to the entire face three nights a week (Monday, Wednesday, Friday).     - Cost: Obtain from [facility] to maintain cost around $75.   - Clindamycin swabs: Use in the morning after washing your face, followed by moisturizer.   - Tretinoin 0.05%: Continue using nightly until Arazlo arrives, then switch.  - Skincare Routine:   - Morning: Wash face with Vanicream, use clindamycin swab, then apply moisturizer.   - Night: Wash face with Vanicream, apply a light layer of hyaluronic acid (Vichy recommended), then Arazlo on specified nights, followed by moisturizer. On non-Arazlo nights, just use hyaluronic acid and moisturizer.   - Moisturizers: Use Vanicream lotion for day and Vanicream cream for night. Adjust based on season and skin sensitivity.  - Other Affected Areas: For back and chest acne, mix a quarter-size amount of moisturizer with a pea-size amount of Arazlo. Be cautious of conditioner residue and hair oils.  - Follow-Up: Follow this routine until June. We will reassess and make necessary adjustments if your skin is not clear by then.   We look forward to seeing the positive changes in your next visit. If you have any questions or concerns before then, please do not hesitate to contact our office.  Warm regards,  Dr. Langston Reusing, Dermatology       Spironolactone can cause increased urination and cause blood pressure to decrease. Please watch for signs of lightheadedness and be cautious when changing position. It can sometimes cause breast  tenderness or an irregular period in premenopausal women. It can also increase potassium. The increase in potassium usually is not a concern unless you are taking other medicines that also increase potassium, so please be sure your doctor knows all of the other medications you are taking. This medication should not be taken by pregnant women.  This medicine should also not be taken together with sulfa drugs like Bactrim (trimethoprim/sulfamethexazole).     Important Information  Due to recent changes in healthcare laws, you may see results of your pathology and/or laboratory studies on MyChart before the doctors have had a chance to review them. We understand that in some cases there may be results that are confusing or concerning to you. Please understand that not all results are received at the same time and often the doctors may need to interpret multiple results in order to provide you with the best plan of care or course of treatment. Therefore, we ask that you please give [place] 2 business days to thoroughly review all your results before contacting the office for clarification. Should we see a critical lab result, you will be contacted sooner.   If You Need Anything After Your Visit  If you have any questions or concerns for your doctor, please call our main line at [phone] If no one answers, please leave a voicemail as directed and we will return your call as soon as possible. Messages left after 4 pm will be answered the following business day.   You may also send [place] a message via MyChart. We  typically respond to MyChart messages within 1-2 business days.  For prescription refills, please ask your pharmacy to contact our office. Our fax number is [phone].  If you have an urgent issue when the clinic is closed that cannot wait until the next business day, you can page your doctor at the number below.    Please note that while we do our best to be available for urgent issues outside of  office hours, we are not available 24/7.   If you have an urgent issue and are unable to reach [place], you may choose to seek medical care at your doctor's office, retail clinic, urgent care center, or emergency room.  If you have a medical emergency, please immediately call 911 or go to the emergency department. In the event of inclement weather, please call our main line at [phone] for an update on the status of any delays or closures.  Dermatology Medication Tips: Please keep the boxes that topical medications come in in order to help keep track of the instructions about where and how to use these. Pharmacies typically print the medication instructions only on the boxes and not directly on the medication tubes.   If your medication is too expensive, please contact our office at [phone] or send [place] a message through MyChart.   We are unable to tell what your co-pay for medications will be in advance as this is different depending on your insurance coverage. However, we may be able to find a substitute medication at lower cost or fill out paperwork to get insurance to cover a needed medication.   If a prior authorization is required to get your medication covered by your insurance company, please allow [place] 1-2 business days to complete this process.  Drug prices often vary depending on where the prescription is filled and some pharmacies may offer cheaper prices.  The website www.goodrx.com contains coupons for medications through different pharmacies. The prices here do not account for what the cost may be with help from insurance (it may be cheaper with your insurance), but the website can give you the price if you did not use any insurance.  - You can print the associated coupon and take it with your prescription to the pharmacy.  - You may also stop by our office during regular business hours and pick up a GoodRx coupon card.  - If you need your prescription sent electronically to a  different pharmacy, notify our office through [facility] or by phone at [phone]

## 2023-10-22 NOTE — Progress Notes (Signed)
   New Patient Visit   Subjective  Isabel Hart is a 23 y.o. female who presents for the following: Acne Vulgaris of face - She is treating with Tretinoin 0.05% cream 3 times per week. She was prescribed BP but she did not use it. She was also given a 7 day course of doxycycline for an inflamed cyst of her buttock last July. It seems to get worse around menses.    The following portions of the chart were reviewed this encounter and updated as appropriate: medications, allergies, medical history  Review of Systems:  No other skin or systemic complaints except as noted in HPI or Assessment and Plan.  Objective  Well appearing patient in no apparent distress; mood and affect are within normal limits.  Areas Examined: Face, chest and back  Relevant exam findings are noted in the Assessment and Plan.            Assessment & Plan    ACNE VULGARIS and PIH Exam: Open comedones and inflammatory papules  Assessment: Patient has been using tretinoin 0.05% nightly for six months with minimal improvement. Notable acne flares around menstrual cycles. Some efficacy observed with recent clearance last week, but overall response remains suboptimal.  Treatment Plan:  Discontinue tretinoin 0.05%.   Initiate Arazlo (tazarotene) applied three nights per week (Monday, Wednesday, Friday).   Add spironolactone 100 mg orally daily with dinner.   Start clindamycin topical swabs applied in the morning.   Implement skincare routine:     Wash face with Vanicream cleanser.     Apply hyaluronic acid (Vichy or BC brand).     Apply Arazlo (pea-sized amount) on designated nights.     Use Vanicream moisturizer (lotion for day, cream for night).   For back and chest acne: mix quarter-size moisturizer with pea-size Arazlo.    Return in about 4 months (around 02/19/2024) for Acne.  I, Joanie Coddington, CMA, am acting as scribe for [employer], DO .   Documentation: I have reviewed the above  documentation for accuracy and completeness, and I agree with the above.  Langston Reusing, DO

## 2023-11-04 ENCOUNTER — Ambulatory Visit: Payer: Managed Care, Other (non HMO) | Admitting: Family Medicine

## 2023-11-05 NOTE — Telephone Encounter (Signed)
Unfortunately this medication is no longer covered by the pt's ins plan.  PA was started via Cover My meds, but it was unable to retrieve clinical questions. Message from [facility]: Drug Is not covered by the plan.

## 2023-11-20 ENCOUNTER — Other Ambulatory Visit (HOSPITAL_COMMUNITY): Payer: Self-pay

## 2023-11-20 ENCOUNTER — Telehealth: Payer: Self-pay

## 2023-11-20 NOTE — Telephone Encounter (Signed)
Pharmacy Patient Advocate Encounter   Received notification from  [facility] Portal that prior authorization for Zepbound 2.5MG /0.5ML pen-injectors is required/requested.   Insurance verification completed.   The patient is insured through [employer] .   Per test claim: PA required; PA submitted to above mentioned insurance via CoverMyMeds Key/confirmation #/EOC [id number] Status is pending

## 2023-11-25 ENCOUNTER — Other Ambulatory Visit (HOSPITAL_COMMUNITY): Payer: Self-pay

## 2023-11-25 NOTE — Telephone Encounter (Signed)
 Pharmacy Patient Advocate Encounter  Received notification from [facility] that Prior Authorization for ZEPBOUND 2.5MG /0.5ML PEN has been APPROVED from 10/20/2023 to 07/21/2024. Ran test claim, Copay is $35.00. This test claim was processed through [facility]- copay amounts may vary at other pharmacies due to pharmacy/plan contracts, or as the patient moves through the different stages of their insurance plan.   PA #/Case ID/Reference #: [id number]

## 2023-11-26 ENCOUNTER — Other Ambulatory Visit (HOSPITAL_COMMUNITY): Payer: Self-pay

## 2023-12-16 ENCOUNTER — Other Ambulatory Visit: Payer: Self-pay | Admitting: Family Medicine

## 2023-12-16 DIAGNOSIS — E66812 Obesity, class 2: Secondary | ICD-10-CM

## 2023-12-17 NOTE — Telephone Encounter (Signed)
 Unable to reach patient due to message stating "call cannot be completed at this time".

## 2023-12-17 NOTE — Telephone Encounter (Signed)
 She is supposed to be on 15 mg weekly-- can you call the patient and find out which dose she is taking?

## 2023-12-20 ENCOUNTER — Encounter: Payer: Self-pay | Admitting: Family Medicine

## 2023-12-22 ENCOUNTER — Other Ambulatory Visit: Payer: Self-pay | Admitting: Family Medicine

## 2023-12-22 DIAGNOSIS — E669 Obesity, unspecified: Secondary | ICD-10-CM

## 2024-02-17 ENCOUNTER — Encounter: Payer: Self-pay | Admitting: Family Medicine

## 2024-02-17 DIAGNOSIS — E669 Obesity, unspecified: Secondary | ICD-10-CM

## 2024-02-18 MED ORDER — ZEPBOUND 5 MG/0.5ML ~~LOC~~ SOAJ: 5.0000 mg | SUBCUTANEOUS | 0 refills | Status: DC

## 2024-02-20 ENCOUNTER — Ambulatory Visit: Payer: Managed Care, Other (non HMO) | Admitting: Dermatology

## 2024-03-02 ENCOUNTER — Other Ambulatory Visit: Payer: Self-pay | Admitting: Dermatology

## 2024-03-18 ENCOUNTER — Other Ambulatory Visit: Payer: Self-pay | Admitting: Family Medicine

## 2024-03-18 DIAGNOSIS — E669 Obesity, unspecified: Secondary | ICD-10-CM

## 2024-05-15 ENCOUNTER — Encounter: Payer: Self-pay | Admitting: Dermatology

## 2024-05-28 ENCOUNTER — Other Ambulatory Visit: Payer: Self-pay | Admitting: Dermatology

## 2024-06-24 ENCOUNTER — Other Ambulatory Visit: Payer: Self-pay | Admitting: Family Medicine

## 2024-06-24 ENCOUNTER — Encounter: Payer: Self-pay | Admitting: Family Medicine

## 2024-06-24 DIAGNOSIS — E669 Obesity, unspecified: Secondary | ICD-10-CM

## 2024-06-25 MED ORDER — ZEPBOUND 10 MG/0.5ML ~~LOC~~ SOAJ: 10.0000 mg | SUBCUTANEOUS | 0 refills | Status: DC

## 2024-07-01 ENCOUNTER — Ambulatory Visit: Admitting: Dermatology

## 2024-07-01 ENCOUNTER — Encounter: Payer: Self-pay | Admitting: Dermatology

## 2024-07-01 VITALS — BP 112/76

## 2024-07-01 DIAGNOSIS — L700 Acne vulgaris: Secondary | ICD-10-CM

## 2024-07-01 DIAGNOSIS — L810 Postinflammatory hyperpigmentation: Secondary | ICD-10-CM | POA: Diagnosis not present

## 2024-07-01 MED ORDER — CLINDAMYCIN PHOSPHATE 1 % EX SWAB: 1.0000 "application " | Freq: Every morning | CUTANEOUS | 5 refills | Status: AC

## 2024-07-01 MED ORDER — SPIRONOLACTONE 100 MG PO TABS: 100.0000 mg | ORAL_TABLET | Freq: Every day | ORAL | 3 refills | Status: AC

## 2024-07-01 NOTE — Progress Notes (Signed)
   Follow-Up Visit   Subjective  Isabel Hart is a 23 y.o. female who presents for the following: Acne Vulgaris 8 month follow up - She is taking Spironolactone  100 mg 1 tablet daily.  She is washing with Vichy cleansing gel, using clindamycin  swabs every morning and applying Arazlo  lotion 3 nights per week to face and back. She feels like her face is much better and her back is getting better. She is getting small flares with menses.    The following portions of the chart were reviewed this encounter and updated as appropriate: medications, allergies, medical history  Review of Systems:  No other skin or systemic complaints except as noted in HPI or Assessment and Plan.  Objective  Well appearing patient in no apparent distress; mood and affect are within normal limits.  Areas Examined: Face, chest and back  Relevant exam findings are noted in the Assessment and Plan.              Assessment & Plan    Acne vulgaris and PIH involving face, chest, and back Acne vulgaris is improving with the current treatment regimen. Notable improvement in hyperpigmentation and reduction in flares, which occur approximately every three to four weeks, often around the menstrual cycle. Mild dryness noted around the nose with Arazlo  use. Back and chest acne are also improving, with dark spots lightening.  - Continue Vichy Cleansing Gel for washing. - Continue clindamycin  12s every morning on face, chest, and back. - Increase Arazlo  application to five nights a week if tolerated, avoiding areas of dryness. - Use Avene moisturizer at night to improve tolerance to Arazlo . - Apply benzoyl peroxide  as a spot treatment in the morning after clindamycin . - Continue spironolactone  with a three-month supply and three refills. - Monitor skin tolerance and adjust regimen as needed during colder months. - Send refills for Arazlo  via MyChart if needed before next follow-up.        Return in  about 6 months (around 12/30/2024) for Acne.  I, Roseline Hutchinson, CMA, am acting as scribe for [employer], DO .   Documentation: I have reviewed the above documentation for accuracy and completeness, and I agree with the above.  Delon Lenis, DO

## 2024-07-01 NOTE — Patient Instructions (Addendum)
 VISIT SUMMARY:  Today, you had a follow-up appointment to discuss your acne treatment. You mentioned that your acne flares up about once every three to four weeks, often around your menstrual cycle. You are currently using BC Cleansing Gel, clindamycin , Arazlo , and have recently started spironolactone . You noted some dryness around your nose from Arazlo , but your back and chest acne are improving.  YOUR PLAN:  -ACNE VULGARIS: Acne vulgaris is a common skin condition that causes pimples and can affect the face, chest, and back. Your acne is improving with your current treatment. Continue using BC Cleansing Gel for washing and clindamycin  every morning on your face, chest, and back. Increase Arazlo  application to five nights a week if you can tolerate it, but avoid areas of dryness. Use Avene moisturizer at night to help with dryness. Apply benzoyl peroxide  as a spot treatment in the morning after clindamycin . Continue taking spironolactone  as prescribed. Monitor your skin's tolerance to the treatment, especially during colder months, and adjust as needed. If you need refills for Arazlo  before your next follow-up, request them via MyChart.  INSTRUCTIONS:  Monitor your skin's tolerance to the treatment, especially during colder months, and adjust as needed. If you need refills for Arazlo  before your next follow-up, request them via MyChart.    Important Information  Due to recent changes in healthcare laws, you may see results of your pathology and/or laboratory studies on MyChart before the doctors have had a chance to review them. We understand that in some cases there may be results that are confusing or concerning to you. Please understand that not all results are received at the same time and often the doctors may need to interpret multiple results in order to provide you with the best plan of care or course of treatment. Therefore, we ask that you please give us  2 business days to thoroughly  review all your results before contacting the office for clarification. Should we see a critical lab result, you will be contacted sooner.   If You Need Anything After Your Visit  If you have any questions or concerns for your doctor, please call our main line at [phone] If no one answers, please leave a voicemail as directed and we will return your call as soon as possible. Messages left after 4 pm will be answered the following business day.   You may also send us  a message via MyChart. We typically respond to MyChart messages within 1-2 business days.  For prescription refills, please ask your pharmacy to contact our office. Our fax number is [phone].  If you have an urgent issue when the clinic is closed that cannot wait until the next business day, you can page your doctor at the number below.    Please note that while we do our best to be available for urgent issues outside of office hours, we are not available 24/7.   If you have an urgent issue and are unable to reach us , you may choose to seek medical care at your doctor's office, retail clinic, urgent care center, or emergency room.  If you have a medical emergency, please immediately call 911 or go to the emergency department. In the event of inclement weather, please call our main line at [phone] for an update on the status of any delays or closures.  Dermatology Medication Tips: Please keep the boxes that topical medications come in in order to help keep track of the instructions about where and how to use these. Pharmacies typically  print the medication instructions only on the boxes and not directly on the medication tubes.   If your medication is too expensive, please contact our office at [phone] or send us  a message through MyChart.   We are unable to tell what your co-pay for medications will be in advance as this is different depending on your insurance coverage. However, we may be able to find a  substitute medication at lower cost or fill out paperwork to get insurance to cover a needed medication.   If a prior authorization is required to get your medication covered by your insurance company, please allow us  1-2 business days to complete this process.  Drug prices often vary depending on where the prescription is filled and some pharmacies may offer cheaper prices.  The website www.goodrx.com contains coupons for medications through different pharmacies. The prices here do not account for what the cost may be with help from insurance (it may be cheaper with your insurance), but the website can give you the price if you did not use any insurance.  - You can print the associated coupon and take it with your prescription to the pharmacy.  - You may also stop by our office during regular business hours and pick up a GoodRx coupon card.  - If you need your prescription sent electronically to a different pharmacy, notify our office through [facility] or by phone at [phone]

## 2024-07-09 ENCOUNTER — Encounter: Payer: Self-pay | Admitting: Family Medicine

## 2024-07-09 ENCOUNTER — Ambulatory Visit (INDEPENDENT_AMBULATORY_CARE_PROVIDER_SITE_OTHER): Admitting: Family Medicine

## 2024-07-09 VITALS — BP 118/62 | HR 62 | Temp 98.4°F | Ht 66.0 in | Wt 137.4 lb

## 2024-07-09 DIAGNOSIS — Z3046 Encounter for surveillance of implantable subdermal contraceptive: Secondary | ICD-10-CM | POA: Diagnosis not present

## 2024-07-09 DIAGNOSIS — Z3009 Encounter for other general counseling and advice on contraception: Secondary | ICD-10-CM | POA: Diagnosis not present

## 2024-07-09 NOTE — Progress Notes (Unsigned)
   Established Patient Office Visit  Subjective   Patient ID: Isabel Hart, female    DOB: March 25, 2001  Age: 23 y.o. MRN: [id number]  Chief Complaint  Patient presents with  . Contraception    Patient states she had a Nexplanon  inserted 3 years ago, possibly October 16th at a birth control clinic-requests removal and re-insertion    HPI   Current Outpatient Medications  Medication Instructions  . ARAZLO  0.045 % LOTN APPLY PEA SIZE AMOUNT TO AFFECTED AREA(S) AT BEDTIME THREE (3) NIGHTS PER WEEK (MONDAY, WEDNESDAY, FRIDAY)  . benzoyl peroxide  5 % external liquid Topical, Daily  . clindamycin  (CLEOCIN  T) 1 % SWAB 1 application , Topical, Every morning  . doxycycline  (ADOXA) 100 mg, 2 times daily  . etonogestrel  (NEXPLANON ) 68 MG IMPL implant 1 each,  Once  . spironolactone  (ALDACTONE ) 100 mg, Oral, Daily, TAKE 1 TABLET(100 MG) BY MOUTH EVERY MORNING  . tretinoin  (RETIN-A ) 0.05 % cream Topical, Daily at bedtime  . Zepbound  10 mg, Subcutaneous, Weekly    Patient Active Problem List   Diagnosis Date Noted  . Dysuria 04/28/2018  . Constipation 04/28/2018  . Hyperhidrosis 11/22/2017  . Heart murmur 11/22/2017  . Obesity (BMI 35.0-39.9 without comorbidity) 11/22/2017  . Acne vulgaris 11/22/2017  . Adjustment disorder with depressed mood 08/22/2014  . Other seasonal allergic rhinitis 07/15/2014     Review of Systems  All other systems reviewed and are negative.     Objective:     BP 118/62   Pulse 62   Temp 98.4 F (36.9 C) (Oral)   Ht 5' 6 (1.676 m)   Wt 137 lb 6.4 oz (62.3 kg)   LMP 07/01/2024 (Exact Date)   SpO2 98%   BMI 22.18 kg/m  {Vitals History (Optional):23777}  Physical Exam Vitals reviewed.  Constitutional:      Appearance: Normal appearance. She is well-groomed and normal weight.  Cardiovascular:     Rate and Rhythm: Normal rate and regular rhythm.     Heart sounds: S1 normal and S2 normal.  Pulmonary:     Effort: Pulmonary effort is normal.      Breath sounds: Normal breath sounds and air entry.  Musculoskeletal:     Right lower leg: No edema.     Left lower leg: No edema.  Neurological:     Mental Status: She is alert and oriented to person, place, and time. Mental status is at baseline.     Gait: Gait is intact.  Psychiatric:        Mood and Affect: Mood and affect normal.        Speech: Speech normal.        Behavior: Behavior normal.        Judgment: Judgment normal.      No results found for any visits on 07/09/24.  {Labs (Optional):23779}  The ASCVD Risk score (Arnett DK, et al., 2019) failed to calculate for the following reasons:   The 2019 ASCVD risk score is only valid for ages 76 to 55    Assessment & Plan:  Encounter for removal and reinsertion of Nexplanon   Encounter for counseling regarding contraception     No follow-ups on file.    Heron CHRISTELLA Sharper, MD

## 2024-07-21 ENCOUNTER — Other Ambulatory Visit (HOSPITAL_COMMUNITY)
Admission: RE | Admit: 2024-07-21 | Discharge: 2024-07-21 | Disposition: A | Source: Ambulatory Visit | Attending: Family Medicine | Admitting: Family Medicine

## 2024-07-21 ENCOUNTER — Encounter: Payer: Self-pay | Admitting: Family Medicine

## 2024-07-21 ENCOUNTER — Ambulatory Visit (INDEPENDENT_AMBULATORY_CARE_PROVIDER_SITE_OTHER): Admitting: Family Medicine

## 2024-07-21 VITALS — BP 102/70 | HR 55 | Temp 99.0°F | Ht 65.5 in | Wt 135.9 lb

## 2024-07-21 DIAGNOSIS — Z1151 Encounter for screening for human papillomavirus (HPV): Secondary | ICD-10-CM | POA: Diagnosis not present

## 2024-07-21 DIAGNOSIS — Z23 Encounter for immunization: Secondary | ICD-10-CM | POA: Diagnosis not present

## 2024-07-21 DIAGNOSIS — Z113 Encounter for screening for infections with a predominantly sexual mode of transmission: Secondary | ICD-10-CM

## 2024-07-21 DIAGNOSIS — Z0000 Encounter for general adult medical examination without abnormal findings: Secondary | ICD-10-CM

## 2024-07-21 DIAGNOSIS — E88819 Insulin resistance, unspecified: Secondary | ICD-10-CM

## 2024-07-21 DIAGNOSIS — Z124 Encounter for screening for malignant neoplasm of cervix: Secondary | ICD-10-CM | POA: Insufficient documentation

## 2024-07-21 DIAGNOSIS — Z1159 Encounter for screening for other viral diseases: Secondary | ICD-10-CM

## 2024-07-21 DIAGNOSIS — Z13220 Encounter for screening for lipoid disorders: Secondary | ICD-10-CM | POA: Diagnosis not present

## 2024-07-21 LAB — COMPREHENSIVE METABOLIC PANEL WITH GFR
ALT: 14 U/L (ref 0–35)
AST: 15 U/L (ref 0–37)
Albumin: 4.5 g/dL (ref 3.5–5.2)
Alkaline Phosphatase: 46 U/L (ref 39–117)
BUN: 12 mg/dL (ref 6–23)
CO2: 24 meq/L (ref 19–32)
Calcium: 9.2 mg/dL (ref 8.4–10.5)
Chloride: 103 meq/L (ref 96–112)
Creatinine, Ser: 0.91 mg/dL (ref 0.40–1.20)
GFR: 89.58 mL/min (ref >60.00)
Glucose, Bld: 83 mg/dL (ref 70–99)
Potassium: 4.1 meq/L (ref 3.5–5.1)
Sodium: 136 meq/L (ref 135–145)
Total Bilirubin: 0.9 mg/dL (ref 0.2–1.2)
Total Protein: 7.5 g/dL (ref 6.0–8.3)

## 2024-07-21 LAB — CBC WITH DIFFERENTIAL/PLATELET
Basophils Absolute: 0 K/uL (ref 0.0–0.1)
Basophils Relative: 0.7 % (ref 0.0–3.0)
Eosinophils Absolute: 0.1 K/uL (ref 0.0–0.7)
Eosinophils Relative: 1.1 % (ref 0.0–5.0)
HCT: 35.6 % — ABNORMAL LOW (ref 36.0–46.0)
Hemoglobin: 11.8 g/dL — ABNORMAL LOW (ref 12.0–15.0)
Lymphocytes Relative: 45.5 % (ref 12.0–46.0)
Lymphs Abs: 2.5 K/uL (ref 0.7–4.0)
MCHC: 33.2 g/dL (ref 30.0–36.0)
MCV: 88.6 fl (ref 78.0–100.0)
Monocytes Absolute: 0.3 K/uL (ref 0.1–1.0)
Monocytes Relative: 4.8 % (ref 3.0–12.0)
Neutro Abs: 2.6 K/uL (ref 1.4–7.7)
Neutrophils Relative %: 47.9 % (ref 43.0–77.0)
Platelets: 275 K/uL (ref 150.0–400.0)
RBC: 4.02 Mil/uL (ref 3.87–5.11)
RDW: 13.1 % (ref 11.5–15.5)
WBC: 5.4 K/uL (ref 4.0–10.5)

## 2024-07-21 LAB — HEMOGLOBIN A1C: Hgb A1c MFr Bld: 5.2 % (ref 4.6–6.5)

## 2024-07-21 LAB — LIPID PANEL
Cholesterol: 174 mg/dL (ref 0–200)
HDL: 47.2 mg/dL (ref >39.00)
LDL Cholesterol: 117 mg/dL — ABNORMAL HIGH (ref 0–99)
NonHDL: 126.66
Total CHOL/HDL Ratio: 4
Triglycerides: 48 mg/dL (ref 0.0–149.0)
VLDL: 9.6 mg/dL (ref 0.0–40.0)

## 2024-07-21 NOTE — Progress Notes (Signed)
 Complete physical exam  Patient: Isabel Hart   DOB: October 15, 2000   23 y.o. Female  MRN: [id number]  Subjective:    Chief Complaint  Patient presents with   Annual Exam    Isabel Hart is a 23 y.o. female who presents today for a complete physical exam. She reports consuming a general diet. Eats good sources of protein, fruits and veggies, eats fast food about three times per week, does not drink soda. Gym/ health club routine includes cardio, light weights, and goes at least 5 days per week. She generally feels well. She reports sleeping well. She does not have additional problems to discuss today.    Most recent fall risk assessment:     No data to display           Most recent depression screenings:    07/21/2024    8:50 AM 09/23/2023    1:58 PM  PHQ 2/9 Scores  PHQ - 2 Score 0 0  PHQ- 9 Score 1 0    Vision:Not within last year  and no vision issues, does not wear glasses or contacts and Dental: No current dental problems and Receives regular dental care  Patient Active Problem List   Diagnosis Date Noted   Dysuria 04/28/2018   Constipation 04/28/2018   Hyperhidrosis 11/22/2017   Heart murmur 11/22/2017   Obesity (BMI 35.0-39.9 without comorbidity) 11/22/2017   Acne vulgaris 11/22/2017   Adjustment disorder with depressed mood 08/22/2014   Other seasonal allergic rhinitis 07/15/2014      Patient Care Team: Ozell Heron HERO, MD as PCP - General (Family Medicine)   Outpatient Medications Prior to Visit  Medication Sig   ARAZLO  0.045 % LOTN APPLY PEA SIZE AMOUNT TO AFFECTED AREA(S) AT BEDTIME THREE (3) NIGHTS PER WEEK (MONDAY, WEDNESDAY, FRIDAY)   benzoyl peroxide  5 % external liquid Apply topically daily.   clindamycin  (CLEOCIN  T) 1 % SWAB Apply 1 application  topically in the morning.   doxycycline  (ADOXA) 100 MG tablet Take 100 mg by mouth 2 (two) times daily.   etonogestrel  (NEXPLANON ) 68 MG IMPL implant 1 each by Subdermal route once.    spironolactone  (ALDACTONE ) 100 MG tablet Take 1 tablet (100 mg total) by mouth daily. TAKE 1 TABLET(100 MG) BY MOUTH EVERY MORNING   tirzepatide  (ZEPBOUND ) 10 MG/0.5ML Pen Inject 10 mg into the skin once a week.   tretinoin  (RETIN-A ) 0.05 % cream Apply topically at bedtime.   No facility-administered medications prior to visit.    Review of Systems  HENT:  Negative for hearing loss.   Eyes:  Negative for blurred vision.  Respiratory:  Negative for shortness of breath.   Cardiovascular:  Negative for chest pain.  Gastrointestinal: Negative.   Genitourinary: Negative.   Musculoskeletal:  Negative for back pain.  Neurological:  Negative for headaches.  Psychiatric/Behavioral:  Negative for depression.        Objective:     BP 102/70   Pulse (!) 55   Temp 99 F (37.2 C) (Oral)   Ht 5' 5.5 (1.664 m)   Wt 135 lb 14.4 oz (61.6 kg)   LMP 07/01/2024 (Exact Date)   SpO2 99%   BMI 22.27 kg/m    Physical Exam Vitals reviewed. Exam conducted with a chaperone present.  Constitutional:      Appearance: She is normal weight.  Cardiovascular:     Rate and Rhythm: Normal rate and regular rhythm.     Pulses: Normal pulses.  Heart sounds: Normal heart sounds.  Pulmonary:     Effort: Pulmonary effort is normal.     Breath sounds: Normal breath sounds.  Chest:     Chest wall: No mass.  Breasts:    Tanner Score is 5.     Right: Normal. No mass or tenderness.     Left: Normal. No mass or tenderness.  Abdominal:     General: Abdomen is flat. Bowel sounds are normal.     Palpations: Abdomen is soft.  Genitourinary:    General: Normal vulva.     Exam position: Lithotomy position.     Tanner stage (genital): 5.     Vagina: Normal.     Cervix: Normal.     Uterus: Normal.      Adnexa: Right adnexa normal and left adnexa normal.     Rectum: Normal.  Lymphadenopathy:     Upper Body:     Right upper body: No axillary adenopathy.     Left upper body: No axillary adenopathy.   Neurological:     General: No focal deficit present.     Mental Status: She is alert and oriented to person, place, and time.  Psychiatric:        Mood and Affect: Mood and affect normal.      No results found for any visits on 07/21/24.     Assessment & Plan:    Routine Health Maintenance and Physical Exam  Immunization History  Administered Date(s) Administered   DTaP 04/16/2002, 07/28/2003, 10/20/2003, 05/03/2005, 03/20/2006   HIB (PRP-OMP) 04/16/2002, 07/28/2003, 10/20/2003, 12/22/2003   HPV Quadrivalent 01/29/2013, 04/09/2013, 08/13/2013   Hepatitis A, Ped/Adol-2 Dose 11/22/2017, 11/12/2018   Hepatitis B 07-31-02, 04/16/2002, 07/28/2003   IPV 04/16/2002, 07/28/2003, 10/20/2003, 03/20/2006   Influenza, Seasonal, Injecte, Preservative Fre 07/21/2024   Influenza,Quad,Nasal, Live 07/15/2014   Influenza,inj,Quad PF,6+ Mos 11/22/2017   Influenza-Unspecified 08/15/2007, 07/08/2008, 07/20/2010, 08/03/2011, 07/22/2012   MMR 04/02/2003, 03/20/2006   Meningococcal Conjugate 04/09/2013, 06/19/2014   Pneumococcal Conjugate-13 04/16/2002, 07/28/2003, 10/20/2003, 12/22/2003   Td 06/19/2014   Tdap 01/29/2013, 06/19/2014   Varicella 07/28/2003, 03/20/2006    Health Maintenance  Topic Date Due   Meningococcal B Vaccine (1 of 2 - Standard) Never done   Hepatitis C Screening  Never done   Cervical Cancer Screening (Pap smear)  Never done   COVID-19 Vaccine (1 - 2025-26 season) Never done   DTaP/Tdap/Td (9 - Td or Tdap) 06/19/2024   Pneumococcal Vaccine  Completed   Influenza Vaccine  Completed   Hepatitis B Vaccines 19-59 Average Risk  Completed   HPV VACCINES  Completed   HIV Screening  Completed    Discussed health benefits of physical activity, and encouraged her to engage in regular exercise appropriate for her age and condition.  Immunization due -     Flu vaccine trivalent PF, 6mos and older(Flulaval,Afluria,Fluarix,Fluzone)  Insulin resistance -     Hemoglobin A1c;  Future  Lipid screening -     Lipid panel; Future  Need for hepatitis C screening test -     Hepatitis C antibody; Future  Routine screening for STI (sexually transmitted infection) -     Cervicovaginal ancillary only  Routine general medical examination at a health care facility -     Comprehensive metabolic panel with GFR; Future -     CBC with Differential/Platelet; Future  Cervical cancer screening -     Cytology - PAP  General physical exam findings are normal today. I reviewed the patient's preventative  testing, immunizations, and lifestyle habits. I made appropriate recommendations and placed orders for the appropriate tests and/or vaccinations. I counseled the patient on the CDC's recommendations for healthy exercise and diet. I counseled the patient on healthy sleep habits and stress management. Handouts to reinforce the counseling were given at the conclusion of the visit.    Return in 6 months (on 01/19/2025) for weight loss.     Heron CHRISTELLA Sharper, MD

## 2024-07-21 NOTE — Patient Instructions (Signed)

## 2024-07-22 LAB — CYTOLOGY - PAP
Comment: NEGATIVE
Diagnosis: NEGATIVE
High risk HPV: NEGATIVE

## 2024-07-22 LAB — CERVICOVAGINAL ANCILLARY ONLY
Bacterial Vaginitis (gardnerella): NEGATIVE
Candida Glabrata: NEGATIVE
Candida Vaginitis: NEGATIVE
Chlamydia: NEGATIVE
Comment: NEGATIVE
Comment: NEGATIVE
Comment: NEGATIVE
Comment: NEGATIVE
Comment: NEGATIVE
Comment: NORMAL
Neisseria Gonorrhea: NEGATIVE
Trichomonas: NEGATIVE

## 2024-07-22 LAB — HEPATITIS C ANTIBODY: Hepatitis C Ab: NONREACTIVE

## 2024-07-23 ENCOUNTER — Ambulatory Visit: Payer: Self-pay | Admitting: Family Medicine

## 2024-09-21 ENCOUNTER — Ambulatory Visit: Admitting: Family Medicine

## 2024-09-22 ENCOUNTER — Ambulatory Visit (INDEPENDENT_AMBULATORY_CARE_PROVIDER_SITE_OTHER): Admitting: Family Medicine

## 2024-09-22 ENCOUNTER — Encounter: Payer: Self-pay | Admitting: Family Medicine

## 2024-09-22 ENCOUNTER — Other Ambulatory Visit (HOSPITAL_COMMUNITY)
Admission: RE | Admit: 2024-09-22 | Discharge: 2024-09-22 | Disposition: A | Discharge status: Home / Self Care | Source: Ambulatory Visit | Attending: Family Medicine | Admitting: Family Medicine

## 2024-09-22 VITALS — BP 98/58 | HR 95 | Temp 98.1°F | Ht 65.5 in | Wt 126.6 lb

## 2024-09-22 DIAGNOSIS — N898 Other specified noninflammatory disorders of vagina: Secondary | ICD-10-CM | POA: Diagnosis not present

## 2024-09-22 LAB — CERVICOVAGINAL ANCILLARY ONLY
Bacterial Vaginitis (gardnerella): NEGATIVE
Candida Glabrata: NEGATIVE
Candida Vaginitis: NEGATIVE
Chlamydia: NEGATIVE
Comment: NEGATIVE
Comment: NEGATIVE
Comment: NEGATIVE
Comment: NEGATIVE
Comment: NEGATIVE
Comment: NORMAL
Neisseria Gonorrhea: NEGATIVE
Trichomonas: NEGATIVE

## 2024-09-22 NOTE — Progress Notes (Signed)
 "  Established Patient Office Visit  Subjective   Patient ID: Isabel Hart, female    DOB: October 30, 2000  Age: 23 y.o. MRN: [id number]  Chief Complaint  Patient presents with   Vaginal Discharge    Patient complains of yellow vaginal discharge since having unprotected sex last week with 2 different partners    Vaginal Discharge The patient's primary symptoms include vaginal discharge.   Discussed the use of AI scribe software for clinical note transcription with the patient, who gave verbal consent to proceed.  History of Present Illness   Isabel Hart is a 23 year old female who presents with unusual yellow vaginal discharge.  She has had increased yellow vaginal discharge since Monday, described as a small amount but more noticeable than usual. She denies itching, burning, pelvic pain or pressure, dysuria, fever, chills, abnormal bleeding, open wounds, or genital bumps or sores. Prior testing in October showed negative high-risk HPV, HIV, hepatitis C, and syphilis, and negative hepatitis screening.       Current Outpatient Medications  Medication Instructions   ARAZLO  0.045 % LOTN APPLY PEA SIZE AMOUNT TO AFFECTED AREA(S) AT BEDTIME THREE (3) NIGHTS PER WEEK (MONDAY, WEDNESDAY, FRIDAY)   benzoyl peroxide  5 % external liquid Topical, Daily   clindamycin  (CLEOCIN  T) 1 % SWAB 1 application , Topical, Every morning   etonogestrel  (NEXPLANON ) 68 MG IMPL implant 1 each,  Once   spironolactone  (ALDACTONE ) 100 mg, Oral, Daily, TAKE 1 TABLET(100 MG) BY MOUTH EVERY MORNING   Zepbound  10 mg, Subcutaneous, Weekly    Patient Active Problem List   Diagnosis Date Noted   Dysuria 04/28/2018   Constipation 04/28/2018   Hyperhidrosis 11/22/2017   Heart murmur 11/22/2017   Obesity (BMI 35.0-39.9 without comorbidity) 11/22/2017   Acne vulgaris 11/22/2017   Adjustment disorder with depressed mood 08/22/2014   Other seasonal allergic rhinitis 07/15/2014     Review of Systems   Genitourinary:  Positive for vaginal discharge.  All other systems reviewed and are negative.     Objective:     BP (!) 98/58   Pulse 95   Temp 98.1 F (36.7 C) (Oral)   Ht 5' 5.5 (1.664 m)   Wt 126 lb 9.6 oz (57.4 kg)   SpO2 98%   BMI 20.75 kg/m    Physical Exam Vitals reviewed.  Constitutional:      Appearance: Normal appearance. She is normal weight.  Pulmonary:     Effort: Pulmonary effort is normal.  Neurological:     Mental Status: She is alert and oriented to person, place, and time. Mental status is at baseline.  Psychiatric:        Mood and Affect: Mood normal.        Behavior: Behavior normal.      No results found for any visits on 09/22/24.    The ASCVD Risk score (Arnett DK, et al., 2019) failed to calculate for the following reasons:   The 2019 ASCVD risk score is only valid for ages 26 to 54   * - Cholesterol units were assumed    Assessment & Plan:  Vaginal discharge -     Cervicovaginal ancillary only   Assessment and Plan    Vaginal discharge Yellow vaginal discharge since Monday without itching, burning, pelvic pressure, pain, dysuria, fever, or chills. Differential diagnosis includes bacterial vaginosis, yeast infection, gonorrhea, chlamydia, and trichomoniasis. Previous high-risk HPV, hepatitis, HIV tests were negative. - Swab for bacterial vaginosis, yeast, gonorrhea, chlamydia, and trichomoniasis was  performed. - Will review results after Christmas and initiate treatment if necessary.        No follow-ups on file.    Heron CHRISTELLA Sharper, MD "

## 2024-09-23 ENCOUNTER — Ambulatory Visit: Payer: Self-pay | Admitting: Family Medicine

## 2024-09-30 ENCOUNTER — Telehealth: Payer: Self-pay

## 2024-09-30 ENCOUNTER — Other Ambulatory Visit (HOSPITAL_COMMUNITY): Payer: Self-pay

## 2024-09-30 NOTE — Telephone Encounter (Signed)
 Pharmacy Patient Advocate Encounter   Received notification from Onbase that prior authorization for Zepbound  10MG /0.5ML pen-injectors is required/requested.   Insurance verification completed.   The patient is insured through [employer].   Per test claim: PA required; PA submitted to above mentioned insurance via Latent Key/confirmation #/EOC [id number] Status is pending

## 2024-10-02 ENCOUNTER — Other Ambulatory Visit (HOSPITAL_COMMUNITY): Payer: Self-pay

## 2024-10-02 NOTE — Telephone Encounter (Signed)
 Pharmacy Patient Advocate Encounter  Received notification from [facility] that Prior Authorization for Zepbound  10MG /0.5ML pen-injectors  has been APPROVED from 08/31/24 to 10/02/25. Ran test claim, Copay is $35. This test claim was processed through [facility]- copay amounts may vary at other pharmacies due to pharmacy/plan contracts, or as the patient moves through the different stages of their insurance plan.   PA #/Case ID/Reference #: [id number]

## 2024-10-05 ENCOUNTER — Other Ambulatory Visit: Payer: Self-pay | Admitting: Family Medicine

## 2024-10-05 DIAGNOSIS — E669 Obesity, unspecified: Secondary | ICD-10-CM

## 2024-10-05 NOTE — Telephone Encounter (Signed)
 Left a detailed message with the approval information below on the pharmacy voicemail at [facility].

## 2024-10-20 ENCOUNTER — Telehealth: Payer: Self-pay

## 2024-10-20 ENCOUNTER — Other Ambulatory Visit (HOSPITAL_COMMUNITY): Payer: Self-pay

## 2024-10-20 NOTE — Telephone Encounter (Signed)
 Pharmacy Patient Advocate Encounter   Received notification from [facility] that prior authorization for ZEPBOUND  is required/requested.   Insurance verification completed.   The patient is insured through [employer].   Per test claim: Member must engage with [facility]

## 2024-10-21 NOTE — Telephone Encounter (Signed)
 Please let patient know that she must go through [facility] for her zepbound 

## 2024-10-21 NOTE — Telephone Encounter (Signed)
 Attempted to call the patient and message was received stating call cannot be completed at this time.

## 2024-10-23 NOTE — Telephone Encounter (Signed)
 Attempted to call the patient and message was received stating call cannot be completed at this time.

## 2024-12-30 ENCOUNTER — Ambulatory Visit: Admitting: Dermatology
